# Patient Record
Sex: Female | Born: 1990
Health system: Southern US, Community
[De-identification: ages and names within clinical notes are randomized; demographics above are authoritative.]

## PROBLEM LIST (undated history)

## (undated) DIAGNOSIS — F419 Anxiety disorder, unspecified: Secondary | ICD-10-CM

## (undated) DIAGNOSIS — F329 Major depressive disorder, single episode, unspecified: Secondary | ICD-10-CM

## (undated) DIAGNOSIS — F32A Depression, unspecified: Secondary | ICD-10-CM

## (undated) DIAGNOSIS — N8110 Cystocele, unspecified: Secondary | ICD-10-CM

## (undated) HISTORY — DX: Anxiety disorder, unspecified: F41.9

## (undated) HISTORY — DX: Depression, unspecified: F32.A

## (undated) HISTORY — DX: Cystocele, unspecified: N81.10

## (undated) HISTORY — PX: WISDOM TOOTH EXTRACTION: SHX21

---
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
## 2019-07-19 ENCOUNTER — Other Ambulatory Visit: Payer: Self-pay

## 2019-07-19 ENCOUNTER — Inpatient Hospital Stay (HOSPITAL_COMMUNITY)
Admission: AD | Admit: 2019-07-19 | Discharge: 2019-07-19 | Disposition: A | Discharge status: Home / Self Care | Payer: Medicare Other | Attending: Obstetrics and Gynecology | Admitting: Obstetrics and Gynecology

## 2019-07-19 ENCOUNTER — Encounter (HOSPITAL_COMMUNITY): Payer: Self-pay | Admitting: *Deleted

## 2019-07-19 DIAGNOSIS — Z5329 Procedure and treatment not carried out because of patient's decision for other reasons: Secondary | ICD-10-CM | POA: Diagnosis not present

## 2019-07-19 DIAGNOSIS — M5440 Lumbago with sciatica, unspecified side: Secondary | ICD-10-CM

## 2019-07-19 DIAGNOSIS — Z3201 Encounter for pregnancy test, result positive: Secondary | ICD-10-CM | POA: Insufficient documentation

## 2019-07-19 DIAGNOSIS — G8929 Other chronic pain: Secondary | ICD-10-CM

## 2019-07-19 DIAGNOSIS — M545 Low back pain, unspecified: Secondary | ICD-10-CM

## 2019-07-19 LAB — POCT PREGNANCY, URINE: Preg Test, Ur: POSITIVE — AB

## 2019-07-19 NOTE — MAU Note (Signed)
.   Christine Kaiser is a 28 y.o. at [redacted]w[redacted]d here in MAU reporting: that she took a pregnancy test two weeks ago and wants To know how far she is. Lower back pain 5/10 LMP: 06/07/19 Onset of complaint: 3 days Pain score: 5 Vitals:   07/19/19 1717  BP: 122/70  Pulse: 82  Resp: 16  Temp: 97.7 F (36.5 C)      Lab orders placed from triage: UA/UPT

## 2019-07-19 NOTE — MAU Provider Note (Signed)
History     CSN: [id number]  Arrival date and time: 07/19/19 1610   First Provider Initiated Contact with Patient 07/19/19 1803      Chief Complaint  Patient presents with  . Possible Pregnancy   Christine Kaiser is a 28 y.o. G1P0 at 1573w0d who presents to MAU stating she "wants to know how far along I am." When patient was asked about any concerns or symptoms, pt reports lower back pain. Pt reports a long history of scoliosis, sciatica and arthritis, but reports three days ago her back pain started to "feel different than normal." Pt denies abdominal pain, pelvic pain, vaginal bleeding, or abnormal vaginal discharge. Pt reports she is strongly considering a termination of pregnancy, which is why she wants to know how far along she is.  Pt's boyfriend present for entire visit.   OB History    Gravida  1   Para      Term      Preterm      AB      Living        SAB      TAB      Ectopic      Multiple      Live Births              No past medical history on file.  No family history on file.  Social History   Tobacco Use  . Smoking status: Not on file  Substance Use Topics  . Alcohol use: Not on file  . Drug use: Not on file    Allergies: Not on File  No medications prior to admission.    Review of Systems  Constitutional: Negative for chills, diaphoresis, fatigue and fever.  Respiratory: Negative for shortness of breath.   Cardiovascular: Negative for chest pain.  Gastrointestinal: Negative for abdominal pain, constipation, diarrhea, nausea and vomiting.  Genitourinary: Negative for dysuria, flank pain, frequency, pelvic pain, urgency, vaginal bleeding and vaginal discharge.  Musculoskeletal: Positive for back pain.  Neurological: Negative for dizziness, weakness, light-headedness and headaches.   Physical Exam   Blood pressure 122/70, pulse 82, temperature 97.7 F (36.5 C), resp. rate 16, last menstrual period 06/07/2019.  Patient  Vitals for the past 24 hrs:  BP Temp Pulse Resp  07/19/19 1717 122/70 97.7 F (36.5 C) 82 16   Physical Exam  Constitutional: She is oriented to person, place, and time. She appears well-developed and well-nourished. No distress.  HENT:  Head: Normocephalic and atraumatic.  Respiratory: Effort normal.  GI: Soft.  Neurological: She is alert and oriented to person, place, and time.  Skin: Skin is warm and dry. She is not diaphoretic.  Psychiatric: She has a normal mood and affect. Her behavior is normal. Judgment and thought content normal.   Results for orders placed or performed during the hospital encounter of 07/19/19 (from the past 24 hour(s))  Pregnancy, urine POC     Status: Abnormal   Collection Time: 07/19/19  4:33 PM  Result Value Ref Range   Preg Test, Ur POSITIVE (A) NEGATIVE   No results found.  MAU Course  Procedures  MDM -per Dr. Earlene Plateravis, will do limited work-up for ectopic pregnancy based on new onset back pain that feels different for patient -after conversation with patient in family room, pt was asked to wait in waiting room while provider consulted Dr. Earlene Plateravis regarding symptoms -after conversation with Dr. Earlene Plateravis, labs and US ordered -lab attempted to call patient from the  waiting room 4-5 times with charge nurse, but patient was not found in waiting room and evaluation was unable to be performed.  Orders Placed This Encounter  Procedures  . US OB LESS THAN 14 WEEKS WITH OB TRANSVAGINAL    Standing Status:   Standing    Number of Occurrences:   1    Order Specific Question:   Symptom/Reason for Exam    Answer:   Low back pain [628366]  . Urinalysis, Routine w reflex microscopic    Standing Status:   Standing    Number of Occurrences:   1  . CBC    Standing Status:   Standing    Number of Occurrences:   1  . hCG, quantitative, pregnancy    Standing Status:   Standing    Number of Occurrences:   1  . Pregnancy, urine POC    Standing Status:   Standing     Number of Occurrences:   1  . ABO/Rh    Standing Status:   Standing    Number of Occurrences:   1   No orders of the defined types were placed in this encounter.  Assessment and Plan   -pt left waiting room AMA  Christine Kaiser 07/19/2019, 7:10 PM

## 2019-08-12 ENCOUNTER — Encounter (HOSPITAL_COMMUNITY): Payer: Self-pay

## 2019-08-17 ENCOUNTER — Other Ambulatory Visit: Payer: Self-pay

## 2019-08-17 ENCOUNTER — Ambulatory Visit (INDEPENDENT_AMBULATORY_CARE_PROVIDER_SITE_OTHER): Payer: Medicare Other

## 2019-08-17 DIAGNOSIS — Z3480 Encounter for supervision of other normal pregnancy, unspecified trimester: Secondary | ICD-10-CM | POA: Insufficient documentation

## 2019-08-17 DIAGNOSIS — O99330 Smoking (tobacco) complicating pregnancy, unspecified trimester: Secondary | ICD-10-CM | POA: Insufficient documentation

## 2019-08-17 MED ORDER — VITAFOL GUMMIES 3.33-0.333-34.8 MG PO CHEW: 1.0000 | CHEWABLE_TABLET | Freq: Every day | ORAL | 11 refills | Status: DC

## 2019-08-17 NOTE — Patient Instructions (Signed)
Smoking During Pregnancy ° °Smoking during pregnancy is unhealthy for you and your baby. Smoke from cigarettes, pipes, and cigars contains many chemicals that can cause cancer (carcinogens). Cigarettes also contain a stimulant drug (nicotine). When you smoke, harmful substances that you breathe in enter your bloodstream and can be passed on to your baby. This can affect your baby's development. °If you are planning to become pregnant or have recently become pregnant, talk with your health care provider about quitting smoking. °How does smoking affect me? °Smoking increases your risk for many long-term (chronic) diseases. These diseases include cancer, lung diseases, and heart disease. Smoking during pregnancy increases your risk of: °· Losing the pregnancy (miscarriage or stillbirth). °· Giving birth too early (premature birth). °· Pregnancy outside of the uterus (tubal pregnancy). °· Having problems with the organ that provides the baby nourishment and oxygen (placenta), including: °? Attachment of the placenta over the opening of the uterus (placenta previa). °? Detachment of the placenta before the baby's birth (placental abruption). °· Having your water break before labor begins (premature rupture of membranes). °How does smoking affect my baby? °Before Birth °Smoking during pregnancy: °· Decreases blood flow and oxygen to your baby. °· Increases your baby’s risk of birth defects, such as heart defects. °· Increases your baby's heart rate. °· Slows your baby's growth in the uterus (intrauterine growth retardation). °After Birth °Babies born to women who smoked during pregnancy may: °· Have symptoms of nicotine withdrawal. °· Need to stay in the hospital for special care. °· May be too small at birth. °· Have a high risk of: °? Serious health problems or lifelong disabilities. °? Sudden infant death syndrome (SIDS). °? Becoming obese. °? Developing behavior or learning problems. °What can happen if changes are  not made? °When babies are born with a birth defect or illness, they often need to stay in the hospital longer before going home. Hospital stays may also be longer if you had any complications during labor or delivery. Longer hospital stays and more treatments result in higher costs for health care. °Many health issues among babies born to mothers who smoke can have a lifelong impact. This may include the long-term need for certain medicines, therapies, or other treatments. °What are the benefits of not smoking during pregnancy? °You have a much better chance of having a healthy pregnancy and a healthy baby if you do not smoke while you are pregnant. Not smoking also means that you will have a better chance of living a long and healthy life, and your baby will have a better chance of growing into a healthy child and adult. °What actions can be taken? °Quitting smoking can be difficult. Ask your health care provider for help to stop smoking. You may also consider: °· Counseling to help you quit smoking (smoking cessation counseling). °· Psychotherapy. °· Acupuncture. °· Hypnosis. °· Telephone QUIT hotlines. °If these methods do not help you, talk with your health care provider about other options. Do not take smoking cessation medicines or nicotine supplements unless your health care provider tells you to. °Where to find more information °Learn more about smoking during pregnancy and quitting smoking from: °· [employer] of [employer]: www.marchofdimes.org/pregnancy/smoking-during-pregnancy.aspx °· U.S. Department of Health and Human Services: women.smokefree.gov °· American Cancer Society: www.cancer.org °· American Heart Association: www.heart.org °· National Cancer Institute: www.cancer.gov °For help to quit smoking: °· National smoking cessation telephone hotline: 1-800-QUIT NOW (784-8669) °Contact a health care provider if: °· You are struggling to quit smoking. °· You   are a smoker and you become pregnant or plan to become  pregnant. °· You start smoking again after giving birth. °Summary °· Tobacco smoke contains harmful substances that can affect a baby’s health and development. °· Smoking increases the risk for serious problems, such as miscarriage, birth defects, or premature birth. °· If you need help to quit smoking, ask your health care provider. °This information is not intended to replace advice given to you by your health care provider. Make sure you discuss any questions you have with your health care provider. °Document Released: 04/01/2005 Document Revised: 10/31/2017 Document Reviewed: 09/06/2016 °Elsevier Patient Education © 2020 [employer] ° °

## 2019-08-17 NOTE — Progress Notes (Addendum)
  Virtual Visit via Telephone Note  I connected with Christine Kaiser on 08/17/19 at  1:15 PM EDT by telephone and verified that I am speaking with the correct person using two identifiers.  Location: Patient: Christine Kaiser Kitchen Provider: Hollice Gong, CMA   I discussed the limitations, risks, security and privacy concerns of performing an evaluation and management service by telephone and the availability of in person appointments. I also discussed with the patient that there may be a patient responsible charge related to this service. The patient expressed understanding and agreed to proceed.   History of Present Illness: PRENATAL INTAKE SUMMARY  [name] presents today New OB Nurse Interview.  OB History    Gravida  2   Para  1   Term  1   Preterm      AB      Living  1     SAB      TAB      Ectopic      Multiple      Live Births  1          I have reviewed the patient's medical, obstetrical, social, and family histories, medications, and available lab results.  SUBJECTIVE She has no unusual complaints   Observations/Objective: Initial nurse interview for history/labs (New OB)  EDD: 03/13/2020 GA: [redacted]w[redacted]d G2P1 FHT: non face to face interview   GENERAL APPEARANCE: oriented to person, place and time, non face to face interview   Assessment and Plan: Normal pregnancy Labs and exam to be completed during NOB exam with CNM Start PNV's BP cuff ordered  Download BRx  Download MyChart  Smoking cessation education given    Follow Up Instructions:   I discussed the assessment and treatment plan with the patient. The patient was provided an opportunity to ask questions and all were answered. The patient agreed with the plan and demonstrated an understanding of the instructions.   The patient was advised to call back or seek an in-person evaluation if the symptoms worsen or if the condition fails to improve as anticipated.  I provided 25 minutes of  non-face-to-face time during this encounter.   Maryruth Eve, CMA

## 2019-08-18 NOTE — Progress Notes (Signed)
I have reviewed this chart and agree with the RN/CMA assessment and management.    K. Meryl Sharronda Schweers, M.D. Attending [facility] ([facility]ulty [facility])   

## 2019-08-31 ENCOUNTER — Other Ambulatory Visit (HOSPITAL_COMMUNITY)
Admission: RE | Admit: 2019-08-31 | Discharge: 2019-08-31 | Disposition: A | Discharge status: Home / Self Care | Payer: Medicare Other | Source: Ambulatory Visit | Attending: Advanced Practice Midwife | Admitting: Advanced Practice Midwife

## 2019-08-31 ENCOUNTER — Other Ambulatory Visit: Payer: Self-pay

## 2019-08-31 ENCOUNTER — Ambulatory Visit (INDEPENDENT_AMBULATORY_CARE_PROVIDER_SITE_OTHER): Payer: Medicare Other | Admitting: Advanced Practice Midwife

## 2019-08-31 ENCOUNTER — Encounter: Payer: Self-pay | Admitting: Advanced Practice Midwife

## 2019-08-31 VITALS — BP 118/79 | HR 87 | Ht 59.0 in | Wt 176.0 lb

## 2019-08-31 DIAGNOSIS — Z3480 Encounter for supervision of other normal pregnancy, unspecified trimester: Secondary | ICD-10-CM | POA: Diagnosis present

## 2019-08-31 DIAGNOSIS — N814 Uterovaginal prolapse, unspecified: Secondary | ICD-10-CM | POA: Insufficient documentation

## 2019-08-31 DIAGNOSIS — Z3687 Encounter for antenatal screening for uncertain dates: Secondary | ICD-10-CM

## 2019-08-31 DIAGNOSIS — Z3A12 12 weeks gestation of pregnancy: Secondary | ICD-10-CM

## 2019-08-31 DIAGNOSIS — Z3481 Encounter for supervision of other normal pregnancy, first trimester: Secondary | ICD-10-CM | POA: Diagnosis not present

## 2019-08-31 DIAGNOSIS — Z23 Encounter for immunization: Secondary | ICD-10-CM | POA: Diagnosis not present

## 2019-08-31 NOTE — Patient Instructions (Signed)
Second Trimester of Pregnancy  The second trimester is from week 14 through week 27 (month 4 through 6). This is often the time in pregnancy that you feel your best. Often times, morning sickness has lessened or quit. You may have more energy, and you may get hungry more often. Your unborn baby is growing rapidly. At the end of the sixth month, he or she is about 9 inches long and weighs about 1 pounds. You will likely feel the baby move between 18 and 20 weeks of pregnancy. Follow these instructions at home: Medicines  Take over-the-counter and prescription medicines only as told by your doctor. Some medicines are safe and some medicines are not safe during pregnancy.  Take a prenatal vitamin that contains at least 600 micrograms (mcg) of folic acid.  If you have trouble pooping (constipation), take medicine that will make your stool soft (stool softener) if your doctor approves. Eating and drinking   Eat regular, healthy meals.  Avoid raw meat and uncooked cheese.  If you get low calcium from the food you eat, talk to your doctor about taking a daily calcium supplement.  Avoid foods that are high in fat and sugars, such as fried and sweet foods.  If you feel sick to your stomach (nauseous) or throw up (vomit): ? Eat 4 or 5 small meals a day instead of 3 large meals. ? Try eating a few soda crackers. ? Drink liquids between meals instead of during meals.  To prevent constipation: ? Eat foods that are high in fiber, like fresh fruits and vegetables, whole grains, and beans. ? Drink enough fluids to keep your pee (urine) clear or pale yellow. Activity  Exercise only as told by your doctor. Stop exercising if you start to have cramps.  Do not exercise if it is too hot, too humid, or if you are in a place of great height (high altitude).  Avoid heavy lifting.  Wear low-heeled shoes. Sit and stand up straight.  You can continue to have sex unless your doctor tells you not to.  Relieving pain and discomfort  Wear a good support bra if your breasts are tender.  Take warm water baths (sitz baths) to soothe pain or discomfort caused by hemorrhoids. Use hemorrhoid cream if your doctor approves.  Rest with your legs raised if you have leg cramps or low back pain.  If you develop puffy, bulging veins (varicose veins) in your legs: ? Wear support hose or compression stockings as told by your doctor. ? Raise (elevate) your feet for 15 minutes, 3-4 times a day. ? Limit salt in your food. Prenatal care  Write down your questions. Take them to your prenatal visits.  Keep all your prenatal visits as told by your doctor. This is important. Safety  Wear your seat belt when driving.  Make a list of emergency phone numbers, including numbers for family, friends, the hospital, and police and fire departments. General instructions  Ask your doctor about the right foods to eat or for help finding a counselor, if you need these services.  Ask your doctor about local prenatal classes. Begin classes before month 6 of your pregnancy.  Do not use hot tubs, steam rooms, or saunas.  Do not douche or use tampons or scented sanitary pads.  Do not cross your legs for long periods of time.  Visit your dentist if you have not done so. Use a soft toothbrush to brush your teeth. Floss gently.  Avoid all smoking, herbs,   and alcohol. Avoid drugs that are not approved by your doctor.  Do not use any products that contain nicotine or tobacco, such as cigarettes and e-cigarettes. If you need help quitting, ask your doctor.  Avoid cat litter boxes and soil used by cats. These carry germs that can cause birth defects in the baby and can cause a loss of your baby (miscarriage) or stillbirth. Contact a doctor if:  You have mild cramps or pressure in your lower belly.  You have pain when you pee (urinate).  You have bad smelling fluid coming from your vagina.  You continue to feel  sick to your stomach (nauseous), throw up (vomit), or have watery poop (diarrhea).  You have a nagging pain in your belly area.  You feel dizzy. Get help right away if:  You have a fever.  You are leaking fluid from your vagina.  You have spotting or bleeding from your vagina.  You have severe belly cramping or pain.  You lose or gain weight rapidly.  You have trouble catching your breath and have chest pain.  You notice sudden or extreme puffiness (swelling) of your face, hands, ankles, feet, or legs.  You have not felt the baby move in over an hour.  You have severe headaches that do not go away when you take medicine.  You have trouble seeing. Summary  The second trimester is from week 14 through week 27 (months 4 through 6). This is often the time in pregnancy that you feel your best.  To take care of yourself and your unborn baby, you will need to eat healthy meals, take medicines only if your doctor tells you to do so, and do activities that are safe for you and your baby.  Call your doctor if you get sick or if you notice anything unusual about your pregnancy. Also, call your doctor if you need help with the right food to eat, or if you want to know what activities are safe for you. This information is not intended to replace advice given to you by your health care provider. Make sure you discuss any questions you have with your health care provider. Document Released: 02/12/2010 Document Revised: 03/12/2019 Document Reviewed: 12/24/2016 Elsevier Patient Education  2020 [employer].  About Cystocele  Overview  The pelvic organs, including the bladder, are normally supported by pelvic floor muscles and ligaments.  When these muscles and ligaments are stretched, weakened or torn, the wall between the bladder and the vagina sags or herniates causing a prolapse, sometimes called a cystocele.  This condition may cause discomfort and problems with emptying the bladder.   It can be present in various stages.  Some people are not aware of the changes.  Others may notice changes at the vaginal opening or a feeling of the bladder dropping outside the body.  Causes of a Cystocele  A cystocele is usually caused by muscle straining or stretching during childbirth.  In addition, cystocele is more common after menopause, because the hormone estrogen helps keep the elastic tissues around the pelvic organs strong.  A cystocele is more likely to occur when levels of estrogen decrease.  Other causes include: heavy lifting, chronic coughing, previous pelvic surgery and obesity.  Symptoms  A bladder that has dropped from its normal position may cause: unwanted urine leakage (stress incontinence), frequent urination or urge to urinate, incomplete emptying of the bladder (not feeling bladder relief after emptying), pain or discomfort in the vagina, pelvis, groin, lower back  or lower abdomen and frequent urinary tract infections.  Mild cases may not cause any symptoms.  Treatment Options  Pelvic floor (Kegel) exercises:  Strength training the muscles in your genital area  Behavioral changes: Treating and preventing constipation, taking time to empty your bladder properly, learning to lift properly and/or avoid heavy lifting when possible, stopping smoking, avoiding weight gain and treating a chronic cough or bronchitis.  A pessary: A vaginal support device is sometimes used to help pelvic support caused by muscle and ligament changes.  Surgery: Surgical repair may be necessary if symptoms cannot be managed with exercise, behavioral changes and a pessary.  Surgery is usually considered for severe cases.   2007, [employer]

## 2019-08-31 NOTE — Addendum Note (Signed)
Addended by: Lewie Loron D on: 08/31/2019 04:32 PM   Modules accepted: Orders

## 2019-08-31 NOTE — Addendum Note (Signed)
Addended by: Lewie Loron D on: 08/31/2019 04:25 PM   Modules accepted: Orders

## 2019-08-31 NOTE — Progress Notes (Signed)
Subjective:   Christine Kaiser is a 28 y.o. G2P1001 at [redacted]w[redacted]d by LMP being seen today for her first obstetrical visit.  Her obstetrical history is significant for NSVD at term x 1 and has Supervision of other normal pregnancy, antepartum; Tobacco smoking affecting pregnancy, antepartum; and Cystocele with prolapse on their problem list.. Patient does intend to breast feed. Pregnancy history fully reviewed.  Patient reports no complaints.  HISTORY: OB History  Gravida Para Term Preterm AB Living  2 1 1  0 0 1  SAB TAB Ectopic Multiple Live Births  0 0 0 0 1    # Outcome Date GA Lbr Len/2nd Weight Sex Delivery Anes PTL Lv  2 Current           1 Term 09/20/14 [redacted]w[redacted]d    Vag-Spont EPI  LIV   Past Medical History:  Diagnosis Date  . Anxiety and depression    History reviewed. No pertinent surgical history. Family History  Problem Relation Age of Onset  . Colon cancer Mother   . Sleep apnea Father   . Diabetes Father    Social History   Tobacco Use  . Smoking status: Current Every Day Smoker    Packs/day: 1.00    Types: Cigarettes  . Smokeless tobacco: Never Used  . Tobacco comment: 1 pack every 3 days   Substance Use Topics  . Alcohol use: Not Currently  . Drug use: Not Currently    Types: Marijuana   Allergies  Allergen Reactions  . Coconut Oil    Current Outpatient Medications on File Prior to Visit  Medication Sig Dispense Refill  . Prenatal Vit-Fe Phos-FA-Omega (VITAFOL GUMMIES) 3.33-0.333-34.8 MG CHEW Chew 1 tablet by mouth daily. 90 tablet 11   No current facility-administered medications on file prior to visit.     Indications for ASA therapy (per uptodate) One of the following: Previous pregnancy with preeclampsia, especially early onset and with an adverse outcome No Multifetal gestation No Chronic hypertension No Type 1 or 2 diabetes mellitus No Chronic kidney disease No Autoimmune disease (antiphospholipid syndrome, systemic lupus erythematosus) No    Two or more of the following: Nulliparity No Obesity (body mass index >30 kg/m2) Yes Family history of preeclampsia in mother or sister No Age ?35 years No Sociodemographic characteristics (African American race, low socioeconomic level) No Personal risk factors (eg, previous pregnancy with low birth weight or small for gestational age infant, previous adverse pregnancy outcome [eg, stillbirth], interval >10 years between pregnancies) No    Exam   Vitals:   08/31/19 1349 08/31/19 1351  BP: 118/79   Pulse: 87   Weight: 176 lb (79.8 kg)   Height:  4\' 11"  (1.499 m)      Uterus:     Pelvic Exam: Perineum: no hemorrhoids, normal perineum   Vulva: normal external genitalia, no lesions   Vagina:  normal mucosa, normal discharge   Cervix: no lesions and normal, pap smear done.    Adnexa: normal adnexa and no mass, fullness, tenderness   Bony Pelvis: average  System: General: well-developed, well-nourished female in no acute distress   Breast:  normal appearance, no masses or tenderness   Skin: normal coloration and turgor, no rashes   Neurologic: oriented, normal, negative, normal mood   Extremities: normal strength, tone, and muscle mass, ROM of all joints is normal   HEENT PERRLA, extraocular movement intact and sclera clear, anicteric   Mouth/Teeth mucous membranes moist, pharynx normal without lesions and dental hygiene  good   Neck supple and no masses   Cardiovascular: regular rate and rhythm   Respiratory:  no respiratory distress, normal breath sounds   Abdomen: soft, non-tender; bowel sounds normal; no masses,  no organomegaly     Assessment:   Pregnancy: G2P1001 Patient Active Problem List   Diagnosis Date Noted  . Cystocele with prolapse 08/31/2019  . Supervision of other normal pregnancy, antepartum 08/17/2019  . Tobacco smoking affecting pregnancy, antepartum 08/17/2019     Plan:  1. Cystocele with prolapse --Pt saw [facility] in 2018 for urinary  difficulty/retention, having to lean forward to urinate and was diagnosed with stage 1 cystocele and fitted for pessary.  Pt wears pessary daily.  She was referred to physical therapy but was delayed due to COVID pandemic.   --On exam, cystocele is not prominent, even with valsalva.  Will try to get pt in for PT now in pregnancy, and PP as needed.  If pt prefers to continue pessary, it is safe to do so in pregnancy. - Ambulatory referral to Physical Therapy  2. Supervision of other normal pregnancy, antepartum --Anticipatory guidance about next visits/weeks of pregnancy given.  - Obstetric Panel, Including HIV - Genetic Screening - Culture, OB Urine - Babyscripts Schedule Optimization  3. Unsure of LMP (last menstrual period) as reason for ultrasound scan --Bedside US reveals active fetus, FHR 160s.  Dating [place] ordered as bedside US indicates possible dates discrepancy but unable to perform measurements today. - US OB Comp Less 14 Wks; Future   Initial labs drawn. Continue prenatal vitamins. Discussed and offered genetic screening options, including Quad screen/AFP, NIPS testing, and option to decline testing. Benefits/risks/alternatives reviewed. Pt aware that anatomy US is form of genetic screening with lower accuracy in detecting trisomies than blood work.  Pt chooses genetic screening today. NIPS: ordered. Ultrasound discussed; fetal anatomic survey: requested. Problem list reviewed and updated. The nature of [place] [facility] with multiple MDs and other Advanced Practice Providers was explained to patient; also emphasized that residents, students are part of our team. Routine obstetric precautions reviewed. No follow-ups on file.   Sharen Counter, CNM 08/31/19 2:57 PM

## 2019-09-01 LAB — CERVICOVAGINAL ANCILLARY ONLY
Chlamydia: NEGATIVE
Molecular Disclaimer: NEGATIVE
Molecular Disclaimer: NORMAL
Neisseria Gonorrhea: NEGATIVE

## 2019-09-02 ENCOUNTER — Other Ambulatory Visit: Payer: Medicare Other

## 2019-09-02 ENCOUNTER — Other Ambulatory Visit: Payer: Self-pay

## 2019-09-02 LAB — CYTOLOGY - PAP: Diagnosis: NEGATIVE

## 2019-09-02 LAB — CULTURE, OB URINE

## 2019-09-02 LAB — URINE CULTURE, OB REFLEX

## 2019-09-03 LAB — OBSTETRIC PANEL, INCLUDING HIV
Antibody Screen: NEGATIVE
Basophils Absolute: 0 10*3/uL (ref 0.0–0.2)
Basos: 0 %
EOS (ABSOLUTE): 0.1 10*3/uL (ref 0.0–0.4)
Eos: 1 %
HIV Screen 4th Generation wRfx: NONREACTIVE
Hematocrit: 40.1 % (ref 34.0–46.6)
Hemoglobin: 13.4 g/dL (ref 11.1–15.9)
Hepatitis B Surface Ag: NEGATIVE
Immature Grans (Abs): 0 10*3/uL (ref 0.0–0.1)
Immature Granulocytes: 0 %
Lymphocytes Absolute: 2.1 10*3/uL (ref 0.7–3.1)
Lymphs: 24 %
MCH: 29.7 pg (ref 26.6–33.0)
MCHC: 33.4 g/dL (ref 31.5–35.7)
MCV: 89 fL (ref 79–97)
Monocytes Absolute: 0.5 10*3/uL (ref 0.1–0.9)
Monocytes: 5 %
Neutrophils Absolute: 6.1 10*3/uL (ref 1.4–7.0)
Neutrophils: 70 %
Platelets: 215 10*3/uL (ref 150–450)
RBC: 4.51 x10E6/uL (ref 3.77–5.28)
RDW: 12.7 % (ref 11.7–15.4)
RPR Ser Ql: NONREACTIVE
Rh Factor: POSITIVE
Rubella Antibodies, IGG: <0.9 index — ABNORMAL LOW (ref >0.99)
WBC: 8.8 10*3/uL (ref 3.4–10.8)

## 2019-09-08 ENCOUNTER — Encounter: Payer: Self-pay | Admitting: Advanced Practice Midwife

## 2019-09-08 ENCOUNTER — Ambulatory Visit (INDEPENDENT_AMBULATORY_CARE_PROVIDER_SITE_OTHER): Payer: Medicare Other

## 2019-09-08 ENCOUNTER — Ambulatory Visit (HOSPITAL_COMMUNITY)
Admission: RE | Admit: 2019-09-08 | Discharge: 2019-09-08 | Disposition: A | Discharge status: Home / Self Care | Payer: Medicare Other | Source: Ambulatory Visit | Attending: Advanced Practice Midwife | Admitting: Advanced Practice Midwife

## 2019-09-08 ENCOUNTER — Other Ambulatory Visit: Payer: Self-pay

## 2019-09-08 DIAGNOSIS — Z3687 Encounter for antenatal screening for uncertain dates: Secondary | ICD-10-CM | POA: Diagnosis present

## 2019-09-08 DIAGNOSIS — Z712 Person consulting for explanation of examination or test findings: Secondary | ICD-10-CM

## 2019-09-08 DIAGNOSIS — Z2839 Other underimmunization status: Secondary | ICD-10-CM | POA: Insufficient documentation

## 2019-09-08 DIAGNOSIS — Z283 Underimmunization status: Secondary | ICD-10-CM | POA: Insufficient documentation

## 2019-09-08 NOTE — Progress Notes (Signed)
Pt here following ultrasound for results. Results reviewed with Kerry Hough, PA who finds single, living IUP. Pt given results and pictures. Notified pt of new dating: 13w 6d today with EDD 03/09/2020. Pt has no further questions and will continue care at [facility] office.   Apolonio Schneiders RN 09/08/19

## 2019-09-08 NOTE — Progress Notes (Signed)
Patient seen and assessed by nursing staff during this encounter. I have reviewed the chart and agree with the documentation and plan.  Kerry Hough, PA-C 09/08/2019 3:47 PM

## 2019-09-15 ENCOUNTER — Ambulatory Visit: Payer: Medicare Other | Attending: Advanced Practice Midwife | Admitting: Physical Therapy

## 2019-09-28 ENCOUNTER — Other Ambulatory Visit: Payer: Self-pay

## 2019-09-28 ENCOUNTER — Ambulatory Visit (INDEPENDENT_AMBULATORY_CARE_PROVIDER_SITE_OTHER): Payer: Medicare Other | Admitting: Advanced Practice Midwife

## 2019-09-28 VITALS — BP 120/78 | HR 103 | Wt 182.6 lb

## 2019-09-28 DIAGNOSIS — Z3A19 19 weeks gestation of pregnancy: Secondary | ICD-10-CM

## 2019-09-28 DIAGNOSIS — Z3480 Encounter for supervision of other normal pregnancy, unspecified trimester: Secondary | ICD-10-CM

## 2019-09-28 DIAGNOSIS — N8110 Cystocele, unspecified: Secondary | ICD-10-CM

## 2019-09-28 DIAGNOSIS — O3482 Maternal care for other abnormalities of pelvic organs, second trimester: Secondary | ICD-10-CM

## 2019-09-28 DIAGNOSIS — O26892 Other specified pregnancy related conditions, second trimester: Secondary | ICD-10-CM

## 2019-09-28 DIAGNOSIS — R109 Unspecified abdominal pain: Secondary | ICD-10-CM

## 2019-09-28 MED ORDER — COMFORT FIT MATERNITY SUPP MED MISC: 1.0000 | Freq: Every day | 0 refills | Status: DC

## 2019-09-28 MED ORDER — BLOOD PRESSURE MONITOR KIT: 1.0000 | PACK | Freq: Once | 0 refills | Status: AC

## 2019-09-28 NOTE — Progress Notes (Signed)
   PRENATAL VISIT NOTE  Subjective:  Christine Kaiser is a 28 y.o. G2P1001 at [redacted]w[redacted]d being seen today for ongoing prenatal care.  She is currently monitored for the following issues for this low-risk pregnancy and has Supervision of other normal pregnancy, antepartum; Tobacco smoking affecting pregnancy, antepartum; Cystocele with prolapse; and Rubella non-immune status, antepartum on their problem list.  Patient reports backache and one episode of lower right quadrant abdominal pain that lasted for two days and ended two days ago.  Contractions: Not present. Vag. Bleeding: None.  Movement: Present. Denies leaking of fluid.   The following portions of the patient's history were reviewed and updated as appropriate: allergies, current medications, past family history, past medical history, past social history, past surgical history and problem list.   Objective:   Vitals:   09/28/19 1454  BP: 120/78  Pulse: (!) 103  Weight: 82.8 kg    Fetal Status: Fetal Heart Rate (bpm): 145   Movement: Present     General:  Alert, oriented and cooperative. Patient is in no acute distress.  Skin: Skin is warm and dry. No rash noted.   Cardiovascular: Normal heart rate noted  Respiratory: Normal respiratory effort, no problems with respiration noted  Abdomen: Soft, gravid, appropriate for gestational age.  Pain/Pressure: Present     Pelvic: Cervical exam deferred        Extremities: Normal range of motion.  Edema: None  Mental Status: Normal mood and affect. Normal behavior. Normal judgment and thought content.   Assessment and Plan:  Pregnancy: G2P1001 at [redacted]w[redacted]d  1. Supervision of other normal pregnancy, antepartum Stable.  Anatomy ultrasound ordered.  OB follow up appointment in 4 weeks - AFP, Serum, Open Spina Bifida - [place] MFM OB COMP + 14 WK; Future  2. Abdominal pain during pregnancy in second trimester Patient advised to try utilizing a maternity support belt to relieve abdominal pain -  Elastic Bandages & Supports (COMFORT FIT MATERNITY SUPP MED) MISC; 1 Device by Does not apply route daily.  Dispense: 1 each; Refill: 0   Preterm labor symptoms and general obstetric precautions including but not limited to vaginal bleeding, contractions, leaking of fluid and fetal movement were reviewed in detail with the patient. Please refer to After Visit Summary for other counseling recommendations.   Return in about 4 weeks (around 10/26/2019).  Future Appointments  Date Time Provider [place]  09/29/2019 11:45 AM Monico Hoar, PT [facility]  10/26/2019  3:15 PM Left[place]-Kirby, Kathie Dike, CNM [place] None    Dorothyann Peng, Student-PA

## 2019-09-28 NOTE — Progress Notes (Signed)
   PRENATAL VISIT NOTE  Subjective:  Christine Kaiser is a 28 y.o. G2P1001 at [redacted]w[redacted]d being seen today for ongoing prenatal care.  She is currently monitored for the following issues for this low-risk pregnancy and has Supervision of other normal pregnancy, antepartum; Tobacco smoking affecting pregnancy, antepartum; Cystocele with prolapse; and Rubella non-immune status, antepartum on their problem list.  Patient reports occasional sharp low abdominal pain with movement.  Contractions: Not present. Vag. Bleeding: None.  Movement: Present. Denies leaking of fluid.   The following portions of the patient's history were reviewed and updated as appropriate: allergies, current medications, past family history, past medical history, past social history, past surgical history and problem list.   Objective:   Vitals:   09/28/19 1454  BP: 120/78  Pulse: (!) 103  Weight: 182 lb 9.6 oz (82.8 kg)    Fetal Status: Fetal Heart Rate (bpm): 145   Movement: Present     General:  Alert, oriented and cooperative. Patient is in no acute distress.  Skin: Skin is warm and dry. No rash noted.   Cardiovascular: Normal heart rate noted  Respiratory: Normal respiratory effort, no problems with respiration noted  Abdomen: Soft, gravid, appropriate for gestational age.  Pain/Pressure: Present     Pelvic: Cervical exam deferred        Extremities: Normal range of motion.  Edema: None  Mental Status: Normal mood and affect. Normal behavior. Normal judgment and thought content.   Assessment and Plan:  Pregnancy: G2P1001 at [redacted]w[redacted]d 1. Supervision of other normal pregnancy, antepartum --Anticipatory guidance about next visits/weeks of pregnancy given. - AFP, Serum, Open Spina Bifida - [place] MFM OB COMP + 14 WK; Future  2. Abdominal pain during pregnancy in second trimester --Occasional sharp bilateral low abdominal pain. --Rest/ice/heat/warm bath/Tylenol/pregnancy support belt - Elastic Bandages & Supports  (COMFORT FIT MATERNITY SUPP MED) MISC; 1 Device by Does not apply route daily.  Dispense: 1 each; Refill: 0  3. Cystocele affecting pregnancy in second trimester --Pt has pessary out currently without problems.  Fitted for pessary due to urinary issues, difficulty emptying bladder before.   --Pt starts PT tomorrow.  Discussed safety of pessary in pregnancy but if she is doing well, I encouraged her to leave it out and work on pelvic floor with exercises.  Preterm labor symptoms and general obstetric precautions including but not limited to vaginal bleeding, contractions, leaking of fluid and fetal movement were reviewed in detail with the patient. Please refer to After Visit Summary for other counseling recommendations.   Return in about 4 weeks (around 10/26/2019).  Future Appointments  Date Time Provider [place]  09/29/2019 11:45 AM Monico Hoar, PT [facility]  10/14/2019  1:30 PM [place] 1 [facility]  10/26/2019  3:15 PM Leftwich-Kirby, Kathie Dike, CNM [facility] None    Fatima Blank, CNM

## 2019-09-29 ENCOUNTER — Ambulatory Visit: Payer: Medicare Other | Admitting: Physical Therapy

## 2019-10-01 LAB — AFP, SERUM, OPEN SPINA BIFIDA
AFP MoM: 1.11
AFP Value: 35.2 ng/mL
Gest. Age on Collection Date: 16.6 weeks
Maternal Age At EDD: 27.8 yr
OSBR Risk 1 IN: 8647
Test Results:: NEGATIVE
Weight: 183 [lb_av]

## 2019-10-14 ENCOUNTER — Other Ambulatory Visit: Payer: Self-pay

## 2019-10-14 ENCOUNTER — Other Ambulatory Visit: Payer: Self-pay | Admitting: Advanced Practice Midwife

## 2019-10-14 ENCOUNTER — Ambulatory Visit (HOSPITAL_COMMUNITY)
Admission: RE | Admit: 2019-10-14 | Discharge: 2019-10-14 | Disposition: A | Discharge status: Home / Self Care | Payer: Medicare Other | Source: Ambulatory Visit | Attending: Advanced Practice Midwife | Admitting: Advanced Practice Midwife

## 2019-10-14 DIAGNOSIS — O99212 Obesity complicating pregnancy, second trimester: Secondary | ICD-10-CM | POA: Diagnosis not present

## 2019-10-14 DIAGNOSIS — Z3480 Encounter for supervision of other normal pregnancy, unspecified trimester: Secondary | ICD-10-CM | POA: Diagnosis not present

## 2019-10-14 DIAGNOSIS — Z3A19 19 weeks gestation of pregnancy: Secondary | ICD-10-CM | POA: Diagnosis not present

## 2019-10-15 ENCOUNTER — Other Ambulatory Visit (HOSPITAL_COMMUNITY): Payer: Self-pay | Admitting: *Deleted

## 2019-10-15 DIAGNOSIS — Z362 Encounter for other antenatal screening follow-up: Secondary | ICD-10-CM

## 2019-10-19 ENCOUNTER — Encounter: Payer: Self-pay | Admitting: Advanced Practice Midwife

## 2019-10-19 DIAGNOSIS — O4402 Placenta previa specified as without hemorrhage, second trimester: Secondary | ICD-10-CM | POA: Insufficient documentation

## 2019-10-26 ENCOUNTER — Ambulatory Visit (INDEPENDENT_AMBULATORY_CARE_PROVIDER_SITE_OTHER): Payer: Medicare Other | Admitting: Advanced Practice Midwife

## 2019-10-26 DIAGNOSIS — G479 Sleep disorder, unspecified: Secondary | ICD-10-CM

## 2019-10-26 DIAGNOSIS — F418 Other specified anxiety disorders: Secondary | ICD-10-CM

## 2019-10-26 DIAGNOSIS — O3482 Maternal care for other abnormalities of pelvic organs, second trimester: Secondary | ICD-10-CM

## 2019-10-26 DIAGNOSIS — Z3A20 20 weeks gestation of pregnancy: Secondary | ICD-10-CM

## 2019-10-26 DIAGNOSIS — O99342 Other mental disorders complicating pregnancy, second trimester: Secondary | ICD-10-CM

## 2019-10-26 DIAGNOSIS — Z3480 Encounter for supervision of other normal pregnancy, unspecified trimester: Secondary | ICD-10-CM

## 2019-10-26 DIAGNOSIS — N8110 Cystocele, unspecified: Secondary | ICD-10-CM

## 2019-10-26 DIAGNOSIS — O4402 Placenta previa specified as without hemorrhage, second trimester: Secondary | ICD-10-CM

## 2019-10-26 DIAGNOSIS — F419 Anxiety disorder, unspecified: Secondary | ICD-10-CM

## 2019-10-26 MED ORDER — HYDROXYZINE PAMOATE 25 MG PO CAPS: 25.0000 mg | ORAL_CAPSULE | Freq: Three times a day (TID) | ORAL | 0 refills | Status: DC | PRN

## 2019-10-26 NOTE — Progress Notes (Signed)
TELEHEALTH OBSTETRICS PRENATAL VIRTUAL VIDEO VISIT ENCOUNTER NOTE  Provider location: [facility] [employer] [facility] at [place]   I connected with Christine Kaiser on 10/26/19 at  3:15 PM EST by WebEx Video Encounter at home and verified that I am speaking with the correct person using two identifiers.   I discussed the limitations, risks, security and privacy concerns of performing an evaluation and management service virtually and the availability of in person appointments. I also discussed with the patient that there may be a patient responsible charge related to this service. The patient expressed understanding and agreed to proceed. Subjective:  Christine Kaiser is a 28 y.o. G2P1001 at [redacted]w[redacted]d being seen today for ongoing prenatal care.  She is currently monitored for the following issues for this low-risk pregnancy and has Supervision of other normal pregnancy, antepartum; Tobacco smoking affecting pregnancy, antepartum; Cystocele with prolapse; Rubella non-immune status, antepartum; and Placenta previa antepartum in second trimester on their problem list.  Patient reports problems sleeping and anxiety at night.  Contractions: Not present. Vag. Bleeding: None.  Movement: Present. Denies any leaking of fluid.   The following portions of the patient's history were reviewed and updated as appropriate: allergies, current medications, past family history, past medical history, past social history, past surgical history and problem list.   Objective:  There were no vitals filed for this visit.  Fetal Status:     Movement: Present     General:  Alert, oriented and cooperative. Patient is in no acute distress.  Respiratory: Normal respiratory effort, no problems with respiration noted  Mental Status: Normal mood and affect. Normal behavior. Normal judgment and thought content.  Rest of physical exam deferred due to type of encounter  Imaging: [place] Mfm Ob Detail +14 Wk  Result Date:  10/14/2019 ----------------------------------------------------------------------  OBSTETRICS REPORT                       (Signed Final 10/14/2019 03:09 pm) ---------------------------------------------------------------------- Patient Info  ID #:       [id number]                          D.O.B.:  Jan 15, 1991 (28 yrs)  Name:       Christine Kaiser               Visit Date: 10/14/2019 01:53 pm ---------------------------------------------------------------------- Performed By  Performed By:     Percell Boston          Ref. Address:     [address]                                                             [phone]  [place]                                                             [id number]  Attending:        Ma RingsVictor Fang MD         [facility]  Referred By:      [facility] ---------------------------------------------------------------------- Orders   #  Description                          Code         Ordered By   1  US MFM OB DETAIL +14 WK              76811.01     Dama Hedgepeth LEFTWICH-                                                        KIRBY  ----------------------------------------------------------------------   #  Order #                    Accession #                 Episode #   1  [id number]                  [id number]                  [id number]  ---------------------------------------------------------------------- Indications   Obesity complicating pregnancy, second         O99.212   trimester   [redacted] weeks gestation of pregnancy                Z3A.19   Encounter for antenatal screening for          Z36.3   malformations(neg AFP, low risk NIPS,   9.6FF)  ---------------------------------------------------------------------- Fetal Evaluation  Num Of Fetuses:         1   Fetal Heart Rate(bpm):  140  Cardiac Activity:       Observed  Presentation:           Breech  Placenta:               Posterior, low-lying,1.6 cm from int os  P. Cord Insertion:      Visualized, central  Amniotic Fluid  AFI FV:      Within normal limits                              Largest Pocket(cm)  3.23 ---------------------------------------------------------------------- Biometry  BPD:      40.1  mm     G. Age:  18w 1d         17  %    CI:         68.1   %    70 - 86                                                          FL/HC:      17.1   %    16.1 - 18.3  HC:      155.4  mm     G. Age:  18w 3d         19  %    HC/AC:      1.19        1.09 - 1.39  AC:      130.8  mm     G. Age:  18w 4d         32  %    FL/BPD:     66.3   %  FL:       26.6  mm     G. Age:  18w 1d         15  %    FL/AC:      20.3   %    20 - 24  HUM:      26.6  mm     G. Age:  18w 3d         34  %  CER:      20.3  mm     G. Age:  19w 2d         57  %  NFT:       2.8  mm  CM:        5.1  mm  Est. FW:     237  gm      0 lb 8 oz     15  % ---------------------------------------------------------------------- OB History  Gravidity:    2         Term:   1        Prem:   0        SAB:   0  TOP:          0       Ectopic:  0        Living: 1 ---------------------------------------------------------------------- Gestational Age  LMP:           18w 3d        Date:  06/07/19                 EDD:   03/13/20  U/S Today:     18w 2d                                        EDD:   03/14/20  Best:          19w 0d     Det. ByLoman Chroman         EDD:   03/09/20                                      (  09/08/19) ---------------------------------------------------------------------- Anatomy  Cranium:               Appears normal         Aortic Arch:            Not well visualized  Cavum:                 Appears normal         Ductal Arch:            Appears normal  Ventricles:            Appears normal         Diaphragm:               Appears normal  Choroid Plexus:        Appears normal         Stomach:                Appears normal, left                                                                        sided  Cerebellum:            Appears normal         Abdomen:                Appears normal  Posterior Fossa:       Appears normal         Abdominal Wall:         Appears nml (cord                                                                        insert, abd wall)  Nuchal Fold:           Appears normal         Cord Vessels:           Appears normal (3                                                                        vessel cord)  Face:                  Not well visualized    Kidneys:                Appear normal  Lips:                  Not well visualized    Bladder:                Appears normal  Thoracic:              Appears normal  Spine:                  Appears normal  Heart:                 Not well visualized    Upper Extremities:      Appears normal  RVOT:                  Not well visualized    Lower Extremities:      Appears normal  Other:  Lt heel visualized. 5th digits visualized. Parents do not wish to know          sex of fetus. Technically difficult due to maternal habitus and fetal          position. ---------------------------------------------------------------------- Cervix Uterus Adnexa  Cervix  Length:            3.1  cm.  Normal appearance by transabdominal scan.  Uterus  No abnormality visualized.  Left Ovary  No adnexal mass visualized.  Right Ovary  No adnexal mass visualized.  Cul De Sac  No free fluid seen.  Adnexa  No abnormality visualized. ---------------------------------------------------------------------- Comments  This patient was seen for a detailed fetal anatomy scan due  to maternal obesity. She denies any significant past medical  history and denies any problems in her current pregnancy.  She had a cell free DNA test earlier in her pregnancy which  indicated a low risk for trisomy  43, 76, and 13.  The patient  did not want the fetal gender revealed today.  She was informed that the fetal growth and amniotic fluid  level were appropriate for her gestational age.  The views of the fetal anatomy were limited today due to the  fetal position and maternal body habitus.  The patient was informed that anomalies may be missed due  to technical limitations. If the fetus is in a suboptimal position  or maternal habitus is increased, visualization of the fetus in  the maternal uterus may be impaired.  A partial placenta previa was noted on today's exam.  Precautions due to the placenta previa were discussed today.  A follow-up exam was scheduled in 4 weeks to obtain better  views of the fetal anatomy and to assess the placental  location. ----------------------------------------------------------------------                   Ma Rings, MD Electronically Signed Final Report   10/14/2019 03:09 pm ----------------------------------------------------------------------   Assessment and Plan:  Pregnancy: G2P1001 at [redacted]w[redacted]d 1. Supervision of other normal pregnancy, antepartum --Pt reports good fetal movement, denies cramping, LOF, or vaginal bleeding --Anticipatory guidance about next visits/weeks of pregnancy given. --Next appt in 2 weeks virtual  2. Cystocele affecting pregnancy in second trimester --Pt was wearing a pessary in early pregnancy, fitted after her previous delivery.  She is not wearing pessary now and is doing well, denies any problems urinating or any pelvic pressure or pain. --Pt had PT appt that she missed, encouraged to call to reschedule for appt during pregnancy, and at least to follow up postpartum.  3. Sleep disturbance --Sleep issues related to pregnancy and anxiety, see below.  Pt to try Vistaril PRN in addition to good sleep hygiene before bed.  Pt encouraged to add pillows, mattress topper to make bed more comfortable.  4. Situational anxiety --Pt with recent news  that her ex, the father of her [facility] child died related to cancer.  She reports lost sleep, racing  thoughts.  She denies any thoughts of harming herself or others. Desires to speak with counselor.  - hydrOXYzine (VISTARIL) 25 MG capsule; Take 1-2 capsules (25-50 mg total) by mouth 3 (three) times daily as needed for anxiety.  Dispense: 30 capsule; Refill: 0  --Referral to [place], appt made with [facility] as soon as possible.  6. Placenta previa antepartum in second trimester --Per 19 week [place].  I reviewed this finding with pt and answered questions. F/U US scheduled with MFM to reevaluate.  --Pt denies any bleeding or pain.  Preterm labor symptoms and general obstetric precautions including but not limited to vaginal bleeding, contractions, leaking of fluid and fetal movement were reviewed in detail with the patient. I discussed the assessment and treatment plan with the patient. The patient was provided an opportunity to ask questions and all were answered. The patient agreed with the plan and demonstrated an understanding of the instructions. The patient was advised to call back or seek an in-person office evaluation/go to [facility] at [facility] for any urgent or concerning symptoms. Please refer to After Visit Summary for other counseling recommendations.   I provided 15 minutes of face-to-face time during this encounter.  No follow-ups on file.  Future Appointments  Date Time Provider [place]  11/11/2019  1:30 PM [facility] [place] 1 WH-[facility]US [facility]    Tamara Monteith Leftwich-Kirby, [place] for [employer], [place]

## 2019-11-09 ENCOUNTER — Ambulatory Visit (INDEPENDENT_AMBULATORY_CARE_PROVIDER_SITE_OTHER): Payer: Medicare Other | Admitting: Obstetrics

## 2019-11-09 ENCOUNTER — Other Ambulatory Visit: Payer: Self-pay

## 2019-11-09 ENCOUNTER — Encounter: Payer: Self-pay | Admitting: Obstetrics

## 2019-11-09 VITALS — BP 136/78 | HR 104

## 2019-11-09 DIAGNOSIS — O3482 Maternal care for other abnormalities of pelvic organs, second trimester: Secondary | ICD-10-CM

## 2019-11-09 DIAGNOSIS — K219 Gastro-esophageal reflux disease without esophagitis: Secondary | ICD-10-CM

## 2019-11-09 DIAGNOSIS — Z3A22 22 weeks gestation of pregnancy: Secondary | ICD-10-CM

## 2019-11-09 DIAGNOSIS — O0990 Supervision of high risk pregnancy, unspecified, unspecified trimester: Secondary | ICD-10-CM

## 2019-11-09 DIAGNOSIS — N8110 Cystocele, unspecified: Secondary | ICD-10-CM

## 2019-11-09 DIAGNOSIS — O0992 Supervision of high risk pregnancy, unspecified, second trimester: Secondary | ICD-10-CM

## 2019-11-09 DIAGNOSIS — O4402 Placenta previa specified as without hemorrhage, second trimester: Secondary | ICD-10-CM

## 2019-11-09 MED ORDER — FAMOTIDINE 20 MG PO TABS: 20.0000 mg | ORAL_TABLET | Freq: Two times a day (BID) | ORAL | 5 refills | Status: DC

## 2019-11-09 NOTE — BH Specialist Note (Signed)
Integrated Behavioral Health via Telemedicine Video Visit  11/09/2019 Christine Kaiser [id number]  Number of [place] visits: 1 Session Start time: 2:15  Session End time: 2:46 Total time: 31  Referring Provider: Fatima Kaiser, CNM Type of Visit: Video Patient/Family location: Home [facility] Provider location: [facility] All persons participating in visit: [name] and Christine Kaiser    Confirmed patient's address: Yes  Confirmed patient's phone number: Yes  Any changes to demographics: No   Confirmed patient's insurance: Yes  Any changes to patient's insurance: No   Discussed confidentiality: Yes   I connected with Christine Kaiser by a video enabled telemedicine application and verified that I am speaking with the correct person using two identifiers.     I discussed the limitations of evaluation and management by telemedicine and the availability of in person appointments.  I discussed that the purpose of this visit is to provide behavioral health care while limiting exposure to the novel coronavirus.   Discussed there is a possibility of technology failure and discussed alternative modes of communication if that failure occurs.  I discussed that engaging in this video visit, they consent to the provision of behavioral healthcare and the services will be billed under their insurance.  Patient and/or legal guardian expressed understanding and consented to video visit: Yes   PRESENTING CONCERNS: Patient and/or family reports the following symptoms/concerns: Pt states her primary concern today is an increase in anxiety,sleep difficulty, and chewing inside her cheek, after grieving loss of her 1yo son's father in November; uses self-coping strategies and began taking vistaril.  Duration of problem: Over one month increase; anxiety ongoing; Severity of problem: moderate  STRENGTHS (Protective Factors/Coping Skills): Open to treatment and uses  daily self-coping strategies  GOALS ADDRESSED: Patient will: 1.  Reduce symptoms of: anxiety and stress  2.  Increase knowledge and/or ability of: healthy habits  3.  Demonstrate ability to: Increase healthy adjustment to current life circumstances and Continue healthy grieving over loss  INTERVENTIONS: Interventions utilized:  Supportive Counseling, Medication Monitoring and Psychoeducation and/or Health Education Standardized Assessments completed: Not Needed  ASSESSMENT: Patient currently experiencing Grief and Anxiety disorder, unspecified  Patient may benefit from psychoeducation and brief therapeutic interventions regarding coping with symptoms of anxiety escalated by recent grief .  PLAN: 1. Follow up with behavioral health clinician on : Two weeks 2. Behavioral recommendations:  -Continue taking Vistaril as prescribed -Continue using self-coping strategies that have remained helpful 3. Referral(s): [place] (In Clinic)  I discussed the assessment and treatment plan with the patient and/or parent/guardian. They were provided an opportunity to ask questions and all were answered. They agreed with the plan and demonstrated an understanding of the instructions.   They were advised to call back or seek an in-person evaluation if the symptoms worsen or if the condition fails to improve as anticipated.  Christine Kaiser

## 2019-11-09 NOTE — Progress Notes (Signed)
TELEHEALTH OBSTETRICS PRENATAL VIRTUAL VIDEO VISIT ENCOUNTER NOTE  Provider [facility]: [facility]   I connected with [name] on 11/09/19 at  3:00 PM EST by WebEx OB Video Encounter at home and verified that I am speaking with the correct person using two identifiers.   I discussed the limitations, risks, security and privacy concerns of performing an evaluation and management service virtually and the availability of in person appointments. I also discussed with the patient that there may be a patient responsible charge related to this service. The patient expressed understanding and agreed to proceed. Subjective:  Christine FirstJustine E Fiddler is a 28 y.o. G2P1001 at 4254w5d being seen today for ongoing prenatal care.  She is currently monitored for the following issues for this high-risk pregnancy and has Supervision of other normal pregnancy, antepartum; Tobacco smoking affecting pregnancy, antepartum; Cystocele with prolapse; Rubella non-immune status, antepartum; and Placenta previa antepartum in second trimester on their problem list.  Patient reports backache and heartburn.  Contractions: Not present. Vag. Bleeding: None.  Movement: Present. Denies any leaking of fluid.   The following portions of the patient's history were reviewed and updated as appropriate: allergies, current medications, past family history, past medical history, past social history, past surgical history and problem list.   Objective:   Vitals:   11/09/19 1452  BP: 136/78  Pulse: (!) 104    Fetal Status:     Movement: Present     General:  Alert, oriented and cooperative. Patient is in no acute distress.  Respiratory: Normal respiratory effort, no problems with respiration noted  Mental Status: Normal mood and affect. Normal behavior. Normal judgment and thought content.  Rest of physical exam deferred due to type of encounter  Imaging: [place] Mfm Ob Detail +14 Wk  Result Date:  10/14/2019 ----------------------------------------------------------------------  OBSTETRICS REPORT                       (Signed Final 10/14/2019 03:09 pm) ---------------------------------------------------------------------- Patient Info  ID #:       [id number]                          D.O.B.:  1990-12-09 (28 yrs)  Name:       Christine Kaiser               Visit Date: 10/14/2019 01:53 pm ---------------------------------------------------------------------- Performed By  Performed By:     Percell BostonHeather Waken          Ref. Address:     [address]                    [address]                                                             [address]                                                             [phone]  [place]                                                             [phone]  Attending:        Ma RingsVictor Fang MD         [facility]  Referred By:      [facility] ---------------------------------------------------------------------- Orders   #  Description                          Code         Ordered By   1  US MFM OB DETAIL +14 WK              76811.01     LISA LEFTWICH-                                                        KIRBY  ----------------------------------------------------------------------   #  Order #                    Accession #                 Episode #   1  [id number]                  [id number]                  [id number]  ---------------------------------------------------------------------- Indications   Obesity complicating pregnancy, second         O99.212   trimester   [redacted] weeks gestation of pregnancy                Z3A.19   Encounter for antenatal screening for          Z36.3   malformations(neg AFP, low risk NIPS,   9.6FF)  ---------------------------------------------------------------------- Fetal Evaluation  Num Of Fetuses:         1   Fetal Heart Rate(bpm):  140  Cardiac Activity:       Observed  Presentation:           Breech  Placenta:               Posterior, low-lying,1.6 cm from int os  P. Cord Insertion:      Visualized, central  Amniotic Fluid  AFI FV:      Within normal limits                              Largest Pocket(cm)  3.23 ---------------------------------------------------------------------- Biometry  BPD:      40.1  mm     G. Age:  18w 1d         17  %    CI:         68.1   %    70 - 86                                                          FL/HC:      17.1   %    16.1 - 18.3  HC:      155.4  mm     G. Age:  18w 3d         19  %    HC/AC:      1.19        1.09 - 1.39  AC:      130.8  mm     G. Age:  18w 4d         32  %    FL/BPD:     66.3   %  FL:       26.6  mm     G. Age:  18w 1d         15  %    FL/AC:      20.3   %    20 - 24  HUM:      26.6  mm     G. Age:  18w 3d         34  %  CER:      20.3  mm     G. Age:  19w 2d         57  %  NFT:       2.8  mm  CM:        5.1  mm  Est. FW:     237  gm      0 lb 8 oz     15  % ---------------------------------------------------------------------- OB History  Gravidity:    2         Term:   1        Prem:   0        SAB:   0  TOP:          0       Ectopic:  0        Living: 1 ---------------------------------------------------------------------- Gestational Age  LMP:           18w 3d        Date:  06/07/19                 EDD:   03/13/20  U/S Today:     18w 2d                                        EDD:   03/14/20  Best:          19w 0d     Det. ByLoman Chroman         EDD:   03/09/20                                      (  09/08/19) ---------------------------------------------------------------------- Anatomy  Cranium:               Appears normal         Aortic Arch:            Not well visualized  Cavum:                 Appears normal         Ductal Arch:            Appears normal  Ventricles:            Appears normal         Diaphragm:               Appears normal  Choroid Plexus:        Appears normal         Stomach:                Appears normal, left                                                                        sided  Cerebellum:            Appears normal         Abdomen:                Appears normal  Posterior Fossa:       Appears normal         Abdominal Wall:         Appears nml (cord                                                                        insert, abd wall)  Nuchal Fold:           Appears normal         Cord Vessels:           Appears normal (3                                                                        vessel cord)  Face:                  Not well visualized    Kidneys:                Appear normal  Lips:                  Not well visualized    Bladder:                Appears normal  Thoracic:              Appears normal  Spine:                  Appears normal  Heart:                 Not well visualized    Upper Extremities:      Appears normal  RVOT:                  Not well visualized    Lower Extremities:      Appears normal  Other:  Lt heel visualized. 5th digits visualized. Parents do not wish to know          sex of fetus. Technically difficult due to maternal habitus and fetal          position. ---------------------------------------------------------------------- Cervix Uterus Adnexa  Cervix  Length:            3.1  cm.  Normal appearance by transabdominal scan.  Uterus  No abnormality visualized.  Left Ovary  No adnexal mass visualized.  Right Ovary  No adnexal mass visualized.  Cul De Sac  No free fluid seen.  Adnexa  No abnormality visualized. ---------------------------------------------------------------------- Comments  This patient was seen for a detailed fetal anatomy scan due  to maternal obesity. She denies any significant past medical  history and denies any problems in her current pregnancy.  She had a cell free DNA test earlier in her pregnancy which  indicated a low risk for trisomy  49, 37, and 13.  The patient  did not want the fetal gender revealed today.  She was informed that the fetal growth and amniotic fluid  level were appropriate for her gestational age.  The views of the fetal anatomy were limited today due to the  fetal position and maternal body habitus.  The patient was informed that anomalies may be missed due  to technical limitations. If the fetus is in a suboptimal position  or maternal habitus is increased, visualization of the fetus in  the maternal uterus may be impaired.  A partial placenta previa was noted on today's exam.  Precautions due to the placenta previa were discussed today.  A follow-up exam was scheduled in 4 weeks to obtain better  views of the fetal anatomy and to assess the placental  location. ----------------------------------------------------------------------                   Ma Rings, MD Electronically Signed Final Report   10/14/2019 03:09 pm ----------------------------------------------------------------------   Assessment and Plan:  Pregnancy: G2P1001 at [redacted]w[redacted]d 1. Supervision of high risk pregnancy, antepartum  2. Placenta previa antepartum in second trimester - previa precautions - follow up ultrasound at ~ 28 weeks  3. Cystocele affecting pregnancy in second trimester - wears a pessary.  Stable clinically. - any work-up done for collagen vascular disorders?  4. GERD without esophagitis Rx: - famotidine (PEPCID) 20 MG tablet; Take 1 tablet (20 mg total) by mouth 2 (two) times daily.  Dispense: 60 tablet; Refill: 5   Preterm labor symptoms and general obstetric precautions including but not limited to vaginal bleeding, contractions, leaking of fluid and fetal movement were reviewed in detail with the patient. I discussed the assessment and treatment plan with the patient. The patient was provided an opportunity to ask questions and all were answered. The patient agreed with the plan and demonstrated an understanding of the  instructions. The patient was advised to call back or seek an in-person office evaluation/go to [facility] at [facility] for any urgent or concerning symptoms. Please refer to After Visit Summary for other counseling recommendations.   I provided 10 minutes of face-to-face time during this encounter.  Return in about 4 weeks (around 12/07/2019) for MyChart HOB-Faculty Only.  Future Appointments  Date Time Provider Department Center  11/10/2019  2:15 PM [facility] Rosalie Gums [facility]  11/11/2019  1:30 PM WH-MFC [place] 1 WH-MFCUS MFC-US    Coral Ceo, MD Center for [facility], [facility] 11/09/2019

## 2019-11-09 NOTE — Progress Notes (Signed)
Pt has no concerns today.  Has u/s scheduled this week.

## 2019-11-10 ENCOUNTER — Ambulatory Visit (INDEPENDENT_AMBULATORY_CARE_PROVIDER_SITE_OTHER): Payer: Medicare Other | Admitting: Clinical

## 2019-11-10 DIAGNOSIS — F418 Other specified anxiety disorders: Secondary | ICD-10-CM

## 2019-11-10 DIAGNOSIS — F419 Anxiety disorder, unspecified: Secondary | ICD-10-CM

## 2019-11-10 DIAGNOSIS — F4321 Adjustment disorder with depressed mood: Secondary | ICD-10-CM | POA: Diagnosis not present

## 2019-11-10 DIAGNOSIS — O99342 Other mental disorders complicating pregnancy, second trimester: Secondary | ICD-10-CM

## 2019-11-11 ENCOUNTER — Other Ambulatory Visit (HOSPITAL_COMMUNITY): Payer: Self-pay | Admitting: *Deleted

## 2019-11-11 ENCOUNTER — Other Ambulatory Visit: Payer: Self-pay

## 2019-11-11 ENCOUNTER — Ambulatory Visit (HOSPITAL_COMMUNITY)
Admission: RE | Admit: 2019-11-11 | Discharge: 2019-11-11 | Disposition: A | Discharge status: Home / Self Care | Payer: Medicare Other | Source: Ambulatory Visit | Attending: Obstetrics and Gynecology | Admitting: Obstetrics and Gynecology

## 2019-11-11 DIAGNOSIS — Z362 Encounter for other antenatal screening follow-up: Secondary | ICD-10-CM | POA: Diagnosis present

## 2019-11-11 DIAGNOSIS — Z3A23 23 weeks gestation of pregnancy: Secondary | ICD-10-CM

## 2019-11-11 DIAGNOSIS — O99212 Obesity complicating pregnancy, second trimester: Secondary | ICD-10-CM

## 2019-11-17 NOTE — BH Specialist Note (Signed)
Integrated Behavioral Health via Telemedicine Video Visit  11/17/2019 [name] [id number]  Number of [place] visits:  Session Start time: 2:28  Session End time: 2:45 Total time: 17  Referring Provider: Fatima Blank, CNM Type of Visit: Video Patient/Family location: Home [facility] Provider location: [facility] All persons participating in visit: [name] and [place]    Confirmed patient's address: Yes  Confirmed patient's phone number: Yes  Any changes to demographics: No   Confirmed patient's insurance: Yes  Any changes to patient's insurance: No   Discussed confidentiality: At previous visit  I connected with Granville Lewis Alabi  by a video enabled telemedicine application and verified that I am speaking with the correct person using two identifiers.     I discussed the limitations of evaluation and management by telemedicine and the availability of in person appointments.  I discussed that the purpose of this visit is to provide behavioral health care while limiting exposure to the novel coronavirus.   Discussed there is a possibility of technology failure and discussed alternative modes of communication if that failure occurs.  I discussed that engaging in this video visit, they consent to the provision of behavioral healthcare and the services will be billed under their insurance.  Patient and/or legal guardian expressed understanding and consented to video visit: Yes   PRESENTING CONCERNS: Patient and/or family reports the following symptoms/concerns: Pt states she took Vistaril one time and made her too tired; uncertain about continuing. Pt is waking up every few hours at night with overwhelming fatigue. Pt's goal is to gain full custody of her 27yo.  Duration of problem: Over 1.5 month increase; ongoing anxiety; Severity of problem: mild  STRENGTHS (Protective Factors/Coping Skills): Good social support  GOALS  ADDRESSED: Patient will: 1.  Reduce symptoms of: anxiety and stress  2.  Demonstrate ability to: Continue healthy grieving over loss  INTERVENTIONS: Interventions utilized:  Supportive Counseling and Medication Monitoring Standardized Assessments completed: GAD-7 and PHQ 9  ASSESSMENT: Patient currently experiencing Anxiety disorder, unspecified and Grief.   Patient may benefit from continued psychoeducation and brief therapeutic interventions regarding coping with symptoms of anxiety and grief .  PLAN: 1. Follow up with behavioral health clinician on : One month, or earlier, if needed 2. Behavioral recommendations:  -Consider continuing Vistaril as prescribed -Continue using self-coping strategies that have helped in the past -Allow feelings of grief; take naps as needed throughout the day -Accept referrals via [id number] for legal/family law 3. Referral(s): [place] (In Clinic)  I discussed the assessment and treatment plan with the patient and/or parent/guardian. They were provided an opportunity to ask questions and all were answered. They agreed with the plan and demonstrated an understanding of the instructions.   They were advised to call back or seek an in-person evaluation if the symptoms worsen or if the condition fails to improve as anticipated.  [name]  Depression screen [facility] 2/9 11/24/2019  Decreased Interest 0  Down, Depressed, Hopeless 0  PHQ - 2 Score 0  Altered sleeping 3  Tired, decreased energy 3  Change in appetite 0  Feeling bad or failure about yourself  0  Trouble concentrating 0  Moving slowly or fidgety/restless 0  Suicidal thoughts 0  PHQ-9 Score 6   GAD 7 : Generalized Anxiety Score 11/24/2019  Nervous, Anxious, on Edge 1  Control/stop worrying 1  Worry too much - different things 1  Trouble relaxing 1  Restless 0  Easily  annoyed or irritable 1  Afraid - awful might happen 0  Total GAD 7 Score 5

## 2019-11-24 ENCOUNTER — Ambulatory Visit (INDEPENDENT_AMBULATORY_CARE_PROVIDER_SITE_OTHER): Payer: Medicare Other | Admitting: Clinical

## 2019-11-24 ENCOUNTER — Other Ambulatory Visit: Payer: Self-pay

## 2019-11-24 DIAGNOSIS — F419 Anxiety disorder, unspecified: Secondary | ICD-10-CM

## 2019-11-24 DIAGNOSIS — F4321 Adjustment disorder with depressed mood: Secondary | ICD-10-CM

## 2019-11-29 ENCOUNTER — Other Ambulatory Visit (HOSPITAL_COMMUNITY): Payer: Self-pay | Admitting: *Deleted

## 2019-11-29 ENCOUNTER — Encounter (HOSPITAL_COMMUNITY): Payer: Self-pay | Admitting: *Deleted

## 2019-11-29 ENCOUNTER — Ambulatory Visit (HOSPITAL_COMMUNITY)
Admission: RE | Admit: 2019-11-29 | Discharge: 2019-11-29 | Disposition: A | Discharge status: Home / Self Care | Payer: Medicare Other | Source: Ambulatory Visit | Attending: Obstetrics and Gynecology | Admitting: Obstetrics and Gynecology

## 2019-11-29 ENCOUNTER — Ambulatory Visit (HOSPITAL_COMMUNITY): Payer: Medicare Other | Admitting: *Deleted

## 2019-11-29 ENCOUNTER — Other Ambulatory Visit: Payer: Self-pay

## 2019-11-29 DIAGNOSIS — Z362 Encounter for other antenatal screening follow-up: Secondary | ICD-10-CM

## 2019-11-29 DIAGNOSIS — Z283 Underimmunization status: Secondary | ICD-10-CM | POA: Diagnosis present

## 2019-11-29 DIAGNOSIS — O4402 Placenta previa specified as without hemorrhage, second trimester: Secondary | ICD-10-CM | POA: Diagnosis present

## 2019-11-29 DIAGNOSIS — Z3A25 25 weeks gestation of pregnancy: Secondary | ICD-10-CM

## 2019-11-29 DIAGNOSIS — Z2839 Other underimmunization status: Secondary | ICD-10-CM

## 2019-11-29 DIAGNOSIS — Z3689 Encounter for other specified antenatal screening: Secondary | ICD-10-CM

## 2019-11-29 DIAGNOSIS — O99891 Other specified diseases and conditions complicating pregnancy: Secondary | ICD-10-CM | POA: Diagnosis present

## 2019-11-29 DIAGNOSIS — O99332 Smoking (tobacco) complicating pregnancy, second trimester: Secondary | ICD-10-CM | POA: Diagnosis not present

## 2019-11-29 DIAGNOSIS — O99212 Obesity complicating pregnancy, second trimester: Secondary | ICD-10-CM | POA: Diagnosis not present

## 2019-11-29 DIAGNOSIS — O4442 Low lying placenta NOS or without hemorrhage, second trimester: Secondary | ICD-10-CM | POA: Diagnosis not present

## 2019-12-03 NOTE — L&D Delivery Note (Addendum)
OB/GYN Faculty Practice Delivery Note  Christine Kaiser is a 29 y.o. G2P1001 s/p VD at [redacted]w[redacted]d. She was admitted for IOL for GDMA2.   ROM: 4h 39m with clear fluid GBS Status:  Positive/-- (03/11 0315) Maximum Maternal Temperature: 98.4 F  Labor Progress:  Initial SVE: 1/50/-2. Received Cytotec, Foley balloon, Pitocin and AROM.  She then progressed to complete.   Delivery Date/Time: 03/03/20 at 0945 Delivery: Called to room and patient was complete and pushing. Head delivered ROA position. No nuchal cord present. Shoulder and body delivered in usual fashion. Infant with spontaneous cry, placed on mother's abdomen, dried and stimulated. Cord clamped x 2 after 1-minute delay, and cut by maternal aunt. Cord blood drawn. Placenta delivered spontaneously with gentle cord traction. Fundus firm with massage and Pitocin. Labia, perineum, vagina, and cervix inspected inspected with no lacerations.  Baby Weight: pending  Placenta: Sent to L&D Complications: None Lacerations: None EBL: 250 mL Analgesia: Epidural   Infant: APGAR (1 MIN): 8   APGAR (5 MINS): 9   APGAR (10 MINS):     Vondra Brimage, DO 03/03/2020, 10:00 AM  OB FELLOW DELIVERY ATTESTATION  I was gloved and present for the delivery in its entirety, and I agree with the above resident's note.    Jerilynn Birkenhead, MD OB Family Medicine Fellow, [facility] for [employer], [facility]   Jerilynn Birkenhead, MD [facility] Family Medicine Fellow, [facility] for [facility], [facility]

## 2019-12-07 ENCOUNTER — Telehealth (INDEPENDENT_AMBULATORY_CARE_PROVIDER_SITE_OTHER): Payer: Medicare Other | Admitting: Obstetrics & Gynecology

## 2019-12-07 DIAGNOSIS — O99891 Other specified diseases and conditions complicating pregnancy: Secondary | ICD-10-CM

## 2019-12-07 DIAGNOSIS — Z283 Underimmunization status: Secondary | ICD-10-CM

## 2019-12-07 DIAGNOSIS — Z3A26 26 weeks gestation of pregnancy: Secondary | ICD-10-CM

## 2019-12-07 DIAGNOSIS — O4402 Placenta previa specified as without hemorrhage, second trimester: Secondary | ICD-10-CM

## 2019-12-07 DIAGNOSIS — O09899 Supervision of other high risk pregnancies, unspecified trimester: Secondary | ICD-10-CM

## 2019-12-07 NOTE — Progress Notes (Addendum)
I connected with  Christine Kaiser on 12/07/19 by a video enabled telemedicine application and verified that I am speaking with the correct person using two identifiers.   I discussed the limitations of evaluation and management by telemedicine. The patient expressed understanding and agreed to proceed.  ROB.  Reports no complains today.  She is not at home and is unable to check BP.

## 2019-12-07 NOTE — Progress Notes (Signed)
   TELEHEALTH VIRTUAL OBSTETRICS VISIT ENCOUNTER NOTE  I connected with Christine Kaiser on 12/07/19 at  3:00 PM EST by telephone at home and verified that I am speaking with the correct person using two identifiers.   I discussed the limitations, risks, security and privacy concerns of performing an evaluation and management service by telephone and the availability of in person appointments. I also discussed with the patient that there may be a patient responsible charge related to this service. The patient expressed understanding and agreed to proceed.  Subjective:  Christine Kaiser is a 29 y.o. G2P1001 at [redacted]w[redacted]d being followed for ongoing prenatal care.  She is currently monitored for the following issues for this low-risk pregnancy and has Supervision of other normal pregnancy, antepartum; Tobacco smoking affecting pregnancy, antepartum; Cystocele with prolapse; Rubella non-immune status, antepartum; and Placenta previa antepartum in second trimester on their problem list.  Patient reports no complaints. Reports fetal movement. Denies any contractions, bleeding or leaking of fluid.   The following portions of the patient's history were reviewed and updated as appropriate: allergies, current medications, past family history, past medical history, past social history, past surgical history and problem list.   Objective:   General:  Alert, oriented and cooperative.   Mental Status: Normal mood and affect perceived. Normal judgment and thought content.  Rest of physical exam deferred due to type of encounter  Assessment and Plan:  Pregnancy: G2P1001 at [redacted]w[redacted]d 1. Placenta previa antepartum in second trimester 2 hr GTT in 2 weeks  2. Rubella non-immune status, antepartum Rhogam at 28 weeks  Preterm labor symptoms and general obstetric precautions including but not limited to vaginal bleeding, contractions, leaking of fluid and fetal movement were reviewed in detail with the patient.  I  discussed the assessment and treatment plan with the patient. The patient was provided an opportunity to ask questions and all were answered. The patient agreed with the plan and demonstrated an understanding of the instructions. The patient was advised to call back or seek an in-person office evaluation/go to [facility]U at [facility] for any urgent or concerning symptoms. Please refer to After Visit Summary for other counseling recommendations.   I provided 11 minutes of non-face-to-face time during this encounter.  Return in about 2 weeks (around 12/21/2019) for 2 hr GTT.  Future Appointments  Date Time Provider Department Center  12/20/2019  2:00 PM WH-MFC NURSE WH-MFC MFC-US  12/20/2019  2:00 PM WH-MFC [place] 3 WH-MFCUS MFC-US    Scheryl Darter, MD Center for [facility], [facility]

## 2019-12-07 NOTE — Patient Instructions (Signed)

## 2019-12-20 ENCOUNTER — Other Ambulatory Visit (HOSPITAL_COMMUNITY): Payer: Self-pay | Admitting: *Deleted

## 2019-12-20 ENCOUNTER — Ambulatory Visit (HOSPITAL_COMMUNITY): Payer: Medicare Other | Admitting: *Deleted

## 2019-12-20 ENCOUNTER — Other Ambulatory Visit: Payer: Self-pay

## 2019-12-20 ENCOUNTER — Ambulatory Visit (HOSPITAL_COMMUNITY)
Admission: RE | Admit: 2019-12-20 | Discharge: 2019-12-20 | Disposition: A | Discharge status: Home / Self Care | Payer: Medicare Other | Source: Ambulatory Visit | Attending: Obstetrics | Admitting: Obstetrics

## 2019-12-20 ENCOUNTER — Encounter (HOSPITAL_COMMUNITY): Payer: Self-pay

## 2019-12-20 DIAGNOSIS — O99891 Other specified diseases and conditions complicating pregnancy: Secondary | ICD-10-CM | POA: Insufficient documentation

## 2019-12-20 DIAGNOSIS — Z3A28 28 weeks gestation of pregnancy: Secondary | ICD-10-CM

## 2019-12-20 DIAGNOSIS — Z362 Encounter for other antenatal screening follow-up: Secondary | ICD-10-CM

## 2019-12-20 DIAGNOSIS — Z283 Underimmunization status: Secondary | ICD-10-CM

## 2019-12-20 DIAGNOSIS — Z3689 Encounter for other specified antenatal screening: Secondary | ICD-10-CM | POA: Insufficient documentation

## 2019-12-20 DIAGNOSIS — O99332 Smoking (tobacco) complicating pregnancy, second trimester: Secondary | ICD-10-CM | POA: Diagnosis not present

## 2019-12-20 DIAGNOSIS — Z2839 Other underimmunization status: Secondary | ICD-10-CM

## 2019-12-20 DIAGNOSIS — O4402 Placenta previa specified as without hemorrhage, second trimester: Secondary | ICD-10-CM | POA: Diagnosis present

## 2019-12-20 DIAGNOSIS — O99212 Obesity complicating pregnancy, second trimester: Secondary | ICD-10-CM | POA: Diagnosis not present

## 2019-12-20 DIAGNOSIS — Z8759 Personal history of other complications of pregnancy, childbirth and the puerperium: Secondary | ICD-10-CM

## 2019-12-20 DIAGNOSIS — O4442 Low lying placenta NOS or without hemorrhage, second trimester: Secondary | ICD-10-CM

## 2019-12-21 ENCOUNTER — Ambulatory Visit (INDEPENDENT_AMBULATORY_CARE_PROVIDER_SITE_OTHER): Payer: Medicare Other | Admitting: Obstetrics & Gynecology

## 2019-12-21 ENCOUNTER — Other Ambulatory Visit: Payer: Medicare Other

## 2019-12-21 ENCOUNTER — Encounter: Payer: Self-pay | Admitting: Obstetrics & Gynecology

## 2019-12-21 VITALS — BP 113/73 | HR 103 | Wt 202.0 lb

## 2019-12-21 DIAGNOSIS — O24419 Gestational diabetes mellitus in pregnancy, unspecified control: Secondary | ICD-10-CM

## 2019-12-21 DIAGNOSIS — Z3A33 33 weeks gestation of pregnancy: Secondary | ICD-10-CM

## 2019-12-21 DIAGNOSIS — O0990 Supervision of high risk pregnancy, unspecified, unspecified trimester: Secondary | ICD-10-CM

## 2019-12-21 DIAGNOSIS — O0993 Supervision of high risk pregnancy, unspecified, third trimester: Secondary | ICD-10-CM

## 2019-12-21 DIAGNOSIS — Z114 Encounter for screening for human immunodeficiency virus [HIV]: Secondary | ICD-10-CM

## 2019-12-21 NOTE — Progress Notes (Signed)
ROB   CC: None    

## 2019-12-21 NOTE — Progress Notes (Signed)
   PRENATAL VISIT NOTE  Subjective:  Christine Kaiser is a 29 y.o. G2P1001 at [redacted]w[redacted]d being seen today for ongoing prenatal care.  She is currently monitored for the following issues for this low-risk pregnancy and has Supervision of other normal pregnancy, antepartum; Tobacco smoking affecting pregnancy, antepartum; Cystocele with prolapse; Rubella non-immune status, antepartum; and Placenta previa antepartum in second trimester on their problem list.  Patient reports no complaints.  Contractions: Not present. Vag. Bleeding: None.  Movement: Present. Denies leaking of fluid.   The following portions of the patient's history were reviewed and updated as appropriate: allergies, current medications, past family history, past medical history, past social history, past surgical history and problem list.   Objective:   Vitals:   12/21/19 0849  BP: 113/73  Pulse: (!) 103  Weight: 202 lb (91.6 kg)    Fetal Status: Fetal Heart Rate (bpm): 155   Movement: Present     General:  Alert, oriented and cooperative. Patient is in no acute distress.  Skin: Skin is warm and dry. No rash noted.   Cardiovascular: Normal heart rate noted  Respiratory: Normal respiratory effort, no problems with respiration noted  Abdomen: Soft, gravid, appropriate for gestational age.  Pain/Pressure: Present     Pelvic: Cervical exam deferred        Extremities: Normal range of motion.  Edema: None  Mental Status: Normal mood and affect. Normal behavior. Normal judgment and thought content.   Assessment and Plan:  Pregnancy: G2P1001 at [redacted]w[redacted]d 1. Supervision of high risk pregnancy, antepartum Routine 28 week - Glucose Tolerance, 2 Hours w/1 Hour - CBC - RPR - HIV Antibody (routine testing w rflx)  2. Supervision of high risk pregnancy, unspecified, third trimester  Placenta previa resolved - HIV Antibody (routine testing w rflx)  3. Encounter for screening for human immunodeficiency virus (HIV)   - HIV Antibody  (routine testing w rflx)  Preterm labor symptoms and general obstetric precautions including but not limited to vaginal bleeding, contractions, leaking of fluid and fetal movement were reviewed in detail with the patient. Please refer to After Visit Summary for other counseling recommendations.   Return in about 2 weeks (around 01/04/2020) for virtual.  Future Appointments  Date Time Provider Department Center  01/31/2020  3:15 PM WH-[facility]FC [place] 4 [facility]-M[facility]US [facility]-US    Scheryl Darter, MD

## 2019-12-21 NOTE — Patient Instructions (Signed)

## 2019-12-22 LAB — CBC
Hematocrit: 34.9 % (ref 34.0–46.6)
Hemoglobin: 11.7 g/dL (ref 11.1–15.9)
MCH: 29.9 pg (ref 26.6–33.0)
MCHC: 33.5 g/dL (ref 31.5–35.7)
MCV: 89 fL (ref 79–97)
Platelets: 217 10*3/uL (ref 150–450)
RBC: 3.91 x10E6/uL (ref 3.77–5.28)
RDW: 13.1 % (ref 11.7–15.4)
WBC: 16.8 10*3/uL — ABNORMAL HIGH (ref 3.4–10.8)

## 2019-12-22 LAB — GLUCOSE TOLERANCE, 2 HOURS W/ 1HR
Glucose, 1 hour: 182 mg/dL — ABNORMAL HIGH (ref 65–179)
Glucose, 2 hour: 135 mg/dL (ref 65–152)
Glucose, Fasting: 86 mg/dL (ref 65–91)

## 2019-12-22 LAB — HIV ANTIBODY (ROUTINE TESTING W REFLEX): HIV Screen 4th Generation wRfx: NONREACTIVE

## 2019-12-22 LAB — RPR: RPR Ser Ql: NONREACTIVE

## 2019-12-23 DIAGNOSIS — Z8632 Personal history of gestational diabetes: Secondary | ICD-10-CM | POA: Insufficient documentation

## 2019-12-23 DIAGNOSIS — O24419 Gestational diabetes mellitus in pregnancy, unspecified control: Secondary | ICD-10-CM | POA: Insufficient documentation

## 2020-01-04 ENCOUNTER — Telehealth (INDEPENDENT_AMBULATORY_CARE_PROVIDER_SITE_OTHER): Payer: Medicare Other | Admitting: Obstetrics and Gynecology

## 2020-01-04 ENCOUNTER — Encounter: Payer: Self-pay | Admitting: Obstetrics and Gynecology

## 2020-01-04 VITALS — BP 121/70 | HR 113

## 2020-01-04 DIAGNOSIS — O24419 Gestational diabetes mellitus in pregnancy, unspecified control: Secondary | ICD-10-CM

## 2020-01-04 DIAGNOSIS — Z3A30 30 weeks gestation of pregnancy: Secondary | ICD-10-CM

## 2020-01-04 DIAGNOSIS — Z283 Underimmunization status: Secondary | ICD-10-CM

## 2020-01-04 DIAGNOSIS — O99891 Other specified diseases and conditions complicating pregnancy: Secondary | ICD-10-CM

## 2020-01-04 DIAGNOSIS — O4402 Placenta previa specified as without hemorrhage, second trimester: Secondary | ICD-10-CM

## 2020-01-04 DIAGNOSIS — Z3480 Encounter for supervision of other normal pregnancy, unspecified trimester: Secondary | ICD-10-CM

## 2020-01-04 DIAGNOSIS — O09899 Supervision of other high risk pregnancies, unspecified trimester: Secondary | ICD-10-CM

## 2020-01-04 MED ORDER — ACCU-CHEK GUIDE W/DEVICE KIT: 1.0000 | PACK | Freq: Once | 0 refills | Status: AC

## 2020-01-04 MED ORDER — ACCU-CHEK GUIDE VI STRP: ORAL_STRIP | 6 refills | Status: DC

## 2020-01-04 MED ORDER — ACCU-CHEK SOFTCLIX LANCETS MISC: 6 refills | Status: DC

## 2020-01-04 NOTE — Progress Notes (Signed)
Pt states she has been having sharp pain in her groin area, worse the last few days.  Increases with baby movement, hard to lay at night due to pain.

## 2020-01-04 NOTE — Progress Notes (Signed)
TELEHEALTH OBSTETRICS PRENATAL VIRTUAL VIDEO VISIT ENCOUNTER NOTE  Provider location: Center for [facility] at [place]   I connected with Christine Kaiser on 01/04/20 at  4:00 PM EST by MyChart Video Encounter at home and verified that I am speaking with the correct person using two identifiers.   I discussed the limitations, risks, security and privacy concerns of performing an evaluation and management service virtually and the availability of in person appointments. I also discussed with the patient that there may be a patient responsible charge related to this service. The patient expressed understanding and agreed to proceed. Subjective:  Christine Kaiser is a 29 y.o. G2P1001 at 20w5dbeing seen today for ongoing prenatal care.  She is currently monitored for the following issues for this high-risk pregnancy and has Supervision of other normal pregnancy, antepartum; Tobacco smoking affecting pregnancy, antepartum; Cystocele with prolapse; Rubella non-immune status, antepartum; Placenta previa antepartum in second trimester; and Gestational diabetes mellitus (GDM) affecting pregnancy, antepartum on their problem list.  Patient reports general discomforts of pregnancy.  Contractions: Not present. Vag. Bleeding: None.  Movement: Present. Denies any leaking of fluid.   The following portions of the patient's history were reviewed and updated as appropriate: allergies, current medications, past family history, past medical history, past social history, past surgical history and problem list.   Objective:   Vitals:   01/04/20 1351  BP: 121/70  Pulse: (!) 113    Fetal Status:     Movement: Present     General:  Alert, oriented and cooperative. Patient is in no acute distress.  Respiratory: Normal respiratory effort, no problems with respiration noted  Mental Status: Normal mood and affect. Normal behavior. Normal judgment and thought content.  Rest of physical exam deferred due  to type of encounter  Imaging: [place]FM OB FOLLOW UP  Result Date: 12/20/2019 ----------------------------------------------------------------------  OBSTETRICS REPORT                       (Signed Final 12/20/2019 03:20 pm) ---------------------------------------------------------------------- Patient Info  ID #:       [id number]                         D.O.B.:  [birth year](28 yrs)  Name:       Christine Kaiser              Visit Date: 12/20/2019 02:25 pm ---------------------------------------------------------------------- Performed By  Performed By:     HNovella Rob       Ref. Address:     [address]  [place]                                                             [place]  Attending:        Johnell Comings MD         [facility]  Referred By:      [facility] ---------------------------------------------------------------------- Orders   #  Description                          Code         Ordered By   1  [place] MFM OB FOLLOW UP                  [phone]     YU FANG  ----------------------------------------------------------------------   #  Order #                    Accession #                 Episode #   1  [id number]                  [id number]                  [id number]  ---------------------------------------------------------------------- Indications   Low lying placenta, antepartum                 J57.01   Obesity complicating pregnancy, second         O99.212   trimester   Encounter for other antenatal screening        Z36.2   follow-up (low risk NIPS)   Tobacco use complicating pregnancy,            O99.332   second trimester   [redacted] weeks gestation of pregnancy                Z3A.28   ---------------------------------------------------------------------- Vital Signs  Weight (lb): 182                               Height:        4'11"  BMI:         36.76 ---------------------------------------------------------------------- Fetal Evaluation  Num Of Fetuses:         1  Fetal Heart Rate(bpm):  135  Cardiac Activity:       Observed  Presentation:           Cephalic  Placenta:               Posterior  Amniotic Fluid  AFI FV:      Within normal limits  AFI Sum(cm)     %Tile       Largest Pocket(cm)  14.[place]  46          4.77  RUQ(cm)       RLQ(cm)       LUQ(cm)        LLQ(cm)  2.9           3.63          2.81           4.77  Comment:    Resolved Low Lying Placenta ---------------------------------------------------------------------- Biometry  BPD:        69  mm     G. Age:  27w 5d         15  %    CI:        70.68   %    70 - 86                                                          FL/HC:      19.5   %    19.6 - 20.8  HC:      261.6  mm     G. Age:  28w 3d         16  %    HC/AC:      1.08        0.99 - 1.21  AC:      242.6  mm     G. Age:  28w 4d         41  %    FL/BPD:     73.9   %    71 - 87  FL:         51  mm     G. Age:  27w 2d          8  %    FL/AC:      21.0   %    20 - 24  LV:        5.2  mm  Est. FW:    1168  gm      2 lb 9 oz     20  % ---------------------------------------------------------------------- OB History  Gravidity:    2         Term:   1        Prem:   0        SAB:   0  TOP:          0       Ectopic:  0        Living: 1 ---------------------------------------------------------------------- Gestational Age  LMP:           28w 0d        Date:  06/07/19                 EDD:   03/13/20  U/S Today:     28w 0d                                        EDD:   03/13/20  Best:          28w 4d     Det. ByLoman Chroman         EDD:   03/09/20                                      (  09/08/19) ---------------------------------------------------------------------- Anatomy   Cranium:               Appears normal         Aortic Arch:            Previously seen  Cavum:                 Appears normal         Ductal Arch:            Previously seen  Ventricles:            Appears normal         Diaphragm:              Appears normal  Choroid Plexus:        Previously seen        Stomach:                Appears normal, left                                                                        sided  Cerebellum:            Previously seen        Abdomen:                Appears normal  Posterior Fossa:       Previously seen        Abdominal Wall:         Previously seen  Nuchal Fold:           Previously seen        Cord Vessels:           Previously seen  Face:                  Orbits and profile     Kidneys:                Appear normal                         previously seen  Lips:                  Previously seen        Bladder:                Appears normal  Thoracic:              Appears normal         Spine:                  Previously seen  Heart:                 Appears normal         Upper Extremities:      Previously seen                         (4CH, axis, and                         situs)  RVOT:  Previously seen        Lower Extremities:      Previously seen  LVOT:                  Previously seen  Other:  Lt heel previously visualized. 5th digits previously visualized. Parents          do not wish to know sex of fetus. Technically difficult due to maternal          habitus and fetal position. 3VV visualized. ---------------------------------------------------------------------- Cervix Uterus Adnexa  Cervix  Length:           4.43  cm.  Normal appearance by transabdominal scan. ---------------------------------------------------------------------- Comments  The patient has been followed for placenta previa and  maternal obesity. She denies any problems since her last  visit.  The fetal growth and amniotic fluid level were appropriate for  her gestational age  The  previously noted low-lying placenta/placenta previa has  resolved.  The patient was advised to continue routine  antepartum care.  Due to her history of having a smaller size baby, a follow-up  growth scan was scheduled in 6 weeks. ----------------------------------------------------------------------                   Christine Comings, MD Electronically Signed Final Report   12/20/2019 03:20 pm ----------------------------------------------------------------------   Assessment and Plan:  Pregnancy: G2P1001 at 42w5d1. Supervision of other normal pregnancy, antepartum Stable  2. Gestational diabetes mellitus (GDM) affecting pregnancy, antepartum GDM reviewed with pt CBG monitoring and goals reviewed GDM and pregnancy/infant risks reviewed Importance of glycemic control to reduce risks reviewed  - Blood Glucose Monitoring Suppl (ACCU-CHEK GUIDE) w/Device KIT; 1 kit by Does not apply route once for 1 dose.  Dispense: 1 kit; Refill: 0 - glucose blood (ACCU-CHEK GUIDE) test strip; Use 1 test strip each to check glucose 4 times daily  Dispense: 100 each; Refill: 6 - Accu-Chek Softclix Lancets lancets; Use one lancet each to check blood glucose 4 times daily  Dispense: 100 each; Refill: 6 - Referral to [facility]  3. Placenta previa antepartum in second trimester Resolved on last U/S  4. Rubella non-immune status, antepartum Vaccine PP  Preterm labor symptoms and general obstetric precautions including but not limited to vaginal bleeding, contractions, leaking of fluid and fetal movement were reviewed in detail with the patient. I discussed the assessment and treatment plan with the patient. The patient was provided an opportunity to ask questions and all were answered. The patient agreed with the plan and demonstrated an understanding of the instructions. The patient was advised to call back or seek an in-person office evaluation/[facility]. Please refer to After Visit Summary for other counseling recommendations.   I provided 8 minutes of face-to-face time during this encounter.  Return in about 2 weeks (around 01/18/2020) for OB visit, face to face, f/u CBG's, MD provider.  Future Appointments  Date Time Provider [place] 01/04/2020  4:00 PM EChancy Milroy [place]  01/31/2020  3:15 PM [place]  01/31/2020  3:15 PM [place] [facility] [place]

## 2020-01-18 ENCOUNTER — Ambulatory Visit (INDEPENDENT_AMBULATORY_CARE_PROVIDER_SITE_OTHER): Payer: Medicare Other | Admitting: Obstetrics and Gynecology

## 2020-01-18 ENCOUNTER — Encounter: Payer: Self-pay | Admitting: Obstetrics and Gynecology

## 2020-01-18 ENCOUNTER — Other Ambulatory Visit: Payer: Self-pay

## 2020-01-18 VITALS — BP 104/72 | HR 108 | Wt 207.0 lb

## 2020-01-18 DIAGNOSIS — O24419 Gestational diabetes mellitus in pregnancy, unspecified control: Secondary | ICD-10-CM

## 2020-01-18 DIAGNOSIS — Z3480 Encounter for supervision of other normal pregnancy, unspecified trimester: Secondary | ICD-10-CM

## 2020-01-18 DIAGNOSIS — Z3A34 34 weeks gestation of pregnancy: Secondary | ICD-10-CM

## 2020-01-18 DIAGNOSIS — O4402 Placenta previa specified as without hemorrhage, second trimester: Secondary | ICD-10-CM

## 2020-01-18 DIAGNOSIS — N814 Uterovaginal prolapse, unspecified: Secondary | ICD-10-CM

## 2020-01-18 NOTE — Progress Notes (Signed)
   PRENATAL VISIT NOTE  Subjective:  Christine Kaiser is a 29 y.o. G2P1001 at [redacted]w[redacted]d being seen today for ongoing prenatal care.  She is currently monitored for the following issues for this high-risk pregnancy and has Supervision of other normal pregnancy, antepartum; Tobacco smoking affecting pregnancy, antepartum; Cystocele with prolapse; Rubella non-immune status, antepartum; Placenta previa antepartum in second trimester; and Gestational diabetes mellitus (GDM) affecting pregnancy, antepartum on their problem list.  Patient reports no complaints.  Contractions: Not present. Vag. Bleeding: None.  Movement: Present. Denies leaking of fluid.   The following portions of the patient's history were reviewed and updated as appropriate: allergies, current medications, past family history, past medical history, past social history, past surgical history and problem list.   Objective:   Vitals:   01/18/20 1511  BP: 104/72  Pulse: (!) 108  Weight: 207 lb (93.9 kg)    Fetal Status: Fetal Heart Rate (bpm): 130 Fundal Height: 33 cm Movement: Present     General:  Alert, oriented and cooperative. Patient is in no acute distress.  Skin: Skin is warm and dry. No rash noted.   Cardiovascular: Normal heart rate noted  Respiratory: Normal respiratory effort, no problems with respiration noted  Abdomen: Soft, gravid, appropriate for gestational age.  Pain/Pressure: Present     Pelvic: Cervical exam deferred        Extremities: Normal range of motion.     Mental Status: Normal mood and affect. Normal behavior. Normal judgment and thought content.   Assessment and Plan:  Pregnancy: G2P1001 at [redacted]w[redacted]d 1. Supervision of other normal pregnancy, antepartum Patient is doing well without complaints Follow up growth ultrasound 3/1 Patient undecided on contraception and is considering condoms  2. Gestational diabetes mellitus (GDM) affecting pregnancy, antepartum CBGs reviewed and all elevated Scheduled  to meet diabetic educator tomorrow Will return in 2 weeks for CBG review  3. Cystocele with prolapse Follow up with PT post partum  4. Placenta previa antepartum in second trimester resolved  Preterm labor symptoms and general obstetric precautions including but not limited to vaginal bleeding, contractions, leaking of fluid and fetal movement were reviewed in detail with the patient. Please refer to After Visit Summary for other counseling recommendations.   Return in about 2 weeks (around 02/01/2020) for ROB, High risk, in person.  Future Appointments  Date Time Provider Department Center  01/19/2020  3:15 PM NDM-NMCH GDM CLASS NDM-NMCH NDM  01/31/2020  3:15 PM WH-MFC NURSE WH-MFC MFC-US  01/31/2020  3:15 PM WH-MFC [place] 4 WH-MFCUS MFC-US    Catalina Antigua, MD

## 2020-01-18 NOTE — Progress Notes (Signed)
Pt states she is still making dietary changes for glucose management.  Pt has nutrition appt tomorrow.

## 2020-01-19 ENCOUNTER — Other Ambulatory Visit: Payer: Self-pay

## 2020-01-19 ENCOUNTER — Encounter: Payer: Medicare Other | Attending: Advanced Practice Midwife | Admitting: Registered"

## 2020-01-19 DIAGNOSIS — O24419 Gestational diabetes mellitus in pregnancy, unspecified control: Secondary | ICD-10-CM | POA: Diagnosis not present

## 2020-01-21 ENCOUNTER — Encounter: Payer: Self-pay | Admitting: Registered"

## 2020-01-21 NOTE — Progress Notes (Signed)
Patient was seen on 01/21/20 for Gestational Diabetes self-management class [facility]. The following learning objectives were met by the patient during this course:   States the definition of Gestational Diabetes  States why dietary management is important in controlling blood glucose  Describes the effects each nutrient has on blood glucose levels  Demonstrates ability to create a balanced meal plan  Demonstrates carbohydrate counting   States when to check blood glucose levels  Demonstrates proper blood glucose monitoring techniques  States the effect of stress and exercise on blood glucose levels  States the importance of limiting caffeine and abstaining from alcohol and smoking  Blood glucose monitor given: none  Patient instructed to monitor glucose levels: FBS: 60 - <95; 1 hour: <140; 2 hour: <120  Patient received handouts:  Nutrition Diabetes and Pregnancy, including carb counting list  Patient will be seen for follow-up as needed.

## 2020-01-31 ENCOUNTER — Other Ambulatory Visit: Payer: Self-pay

## 2020-01-31 ENCOUNTER — Encounter (HOSPITAL_COMMUNITY): Payer: Self-pay | Admitting: *Deleted

## 2020-01-31 ENCOUNTER — Ambulatory Visit (HOSPITAL_COMMUNITY): Payer: Medicare Other | Admitting: *Deleted

## 2020-01-31 ENCOUNTER — Ambulatory Visit (HOSPITAL_COMMUNITY)
Admission: RE | Admit: 2020-01-31 | Discharge: 2020-01-31 | Disposition: A | Discharge status: Home / Self Care | Payer: Medicare Other | Source: Ambulatory Visit | Attending: Obstetrics | Admitting: Obstetrics

## 2020-01-31 DIAGNOSIS — Z362 Encounter for other antenatal screening follow-up: Secondary | ICD-10-CM

## 2020-01-31 DIAGNOSIS — O99891 Other specified diseases and conditions complicating pregnancy: Secondary | ICD-10-CM | POA: Insufficient documentation

## 2020-01-31 DIAGNOSIS — O99333 Smoking (tobacco) complicating pregnancy, third trimester: Secondary | ICD-10-CM | POA: Diagnosis not present

## 2020-01-31 DIAGNOSIS — Z3A34 34 weeks gestation of pregnancy: Secondary | ICD-10-CM

## 2020-01-31 DIAGNOSIS — O4402 Placenta previa specified as without hemorrhage, second trimester: Secondary | ICD-10-CM | POA: Insufficient documentation

## 2020-01-31 DIAGNOSIS — Z283 Underimmunization status: Secondary | ICD-10-CM | POA: Diagnosis present

## 2020-01-31 DIAGNOSIS — O24419 Gestational diabetes mellitus in pregnancy, unspecified control: Secondary | ICD-10-CM

## 2020-01-31 DIAGNOSIS — Z8759 Personal history of other complications of pregnancy, childbirth and the puerperium: Secondary | ICD-10-CM | POA: Insufficient documentation

## 2020-01-31 DIAGNOSIS — O99213 Obesity complicating pregnancy, third trimester: Secondary | ICD-10-CM | POA: Diagnosis not present

## 2020-01-31 DIAGNOSIS — Z2839 Other underimmunization status: Secondary | ICD-10-CM

## 2020-02-01 ENCOUNTER — Encounter: Payer: Medicare Other | Admitting: Obstetrics and Gynecology

## 2020-02-07 ENCOUNTER — Encounter: Payer: Medicare Other | Admitting: Obstetrics and Gynecology

## 2020-02-10 ENCOUNTER — Other Ambulatory Visit (HOSPITAL_COMMUNITY)
Admission: RE | Admit: 2020-02-10 | Discharge: 2020-02-10 | Disposition: A | Discharge status: Home / Self Care | Payer: Medicare Other | Source: Ambulatory Visit | Attending: Obstetrics & Gynecology | Admitting: Obstetrics & Gynecology

## 2020-02-10 ENCOUNTER — Other Ambulatory Visit: Payer: Self-pay | Admitting: Obstetrics and Gynecology

## 2020-02-10 ENCOUNTER — Ambulatory Visit (INDEPENDENT_AMBULATORY_CARE_PROVIDER_SITE_OTHER): Payer: Medicare Other | Admitting: Obstetrics and Gynecology

## 2020-02-10 ENCOUNTER — Encounter: Payer: Self-pay | Admitting: Obstetrics and Gynecology

## 2020-02-10 ENCOUNTER — Other Ambulatory Visit: Payer: Self-pay

## 2020-02-10 VITALS — BP 125/80 | HR 80 | Wt 212.6 lb

## 2020-02-10 DIAGNOSIS — O99891 Other specified diseases and conditions complicating pregnancy: Secondary | ICD-10-CM

## 2020-02-10 DIAGNOSIS — O24419 Gestational diabetes mellitus in pregnancy, unspecified control: Secondary | ICD-10-CM

## 2020-02-10 DIAGNOSIS — O09899 Supervision of other high risk pregnancies, unspecified trimester: Secondary | ICD-10-CM

## 2020-02-10 DIAGNOSIS — Z3480 Encounter for supervision of other normal pregnancy, unspecified trimester: Secondary | ICD-10-CM | POA: Diagnosis present

## 2020-02-10 DIAGNOSIS — Z3A36 36 weeks gestation of pregnancy: Secondary | ICD-10-CM

## 2020-02-10 DIAGNOSIS — Z283 Underimmunization status: Secondary | ICD-10-CM

## 2020-02-10 MED ORDER — METFORMIN HCL 500 MG PO TABS: 500.0000 mg | ORAL_TABLET | Freq: Every day | ORAL | 0 refills | Status: DC

## 2020-02-10 NOTE — Progress Notes (Signed)
   PRENATAL VISIT NOTE  Subjective:  Christine Kaiser is a 29 y.o. G2P1001 at [redacted]w[redacted]d being seen today for ongoing prenatal care.  She is currently monitored for the following issues for this high-risk pregnancy and has Supervision of other normal pregnancy, antepartum; Tobacco smoking affecting pregnancy, antepartum; Cystocele with prolapse; Rubella non-immune status, antepartum; Placenta previa antepartum in second trimester; and Gestational diabetes mellitus (GDM) affecting pregnancy, antepartum on their problem list.  Patient reports leakage of fluid for the past 2 days with decrease fetal movement.  Contractions: Irregular. Vag. Bleeding: None.  Movement: Present(Simultaneous filing. User may not have seen previous data.). Denies leaking of fluid.   The following portions of the patient's history were reviewed and updated as appropriate: allergies, current medications, past family history, past medical history, past social history, past surgical history and problem list.   Objective:   Vitals:   02/10/20 1448  BP: 125/80  Pulse: 80  Weight: 212 lb 9.6 oz (96.4 kg)    Fetal Status: Fetal Heart Rate (bpm): 160(Simultaneous filing. User may not have seen previous data.) Fundal Height: 36 cm Movement: Present(Simultaneous filing. User may not have seen previous data.)  Presentation: Vertex  General:  Alert, oriented and cooperative. Patient is in no acute distress.  Skin: Skin is warm and dry. No rash noted.   Cardiovascular: Normal heart rate noted  Respiratory: Normal respiratory effort, no problems with respiration noted  Abdomen: Soft, gravid, appropriate for gestational age.  Pain/Pressure: Present     Pelvic: Cervical exam performed Dilation: Closed Effacement (%): Thick Station: Ballotable  Extremities: Normal range of motion.     Mental Status: Normal mood and affect. Normal behavior. Normal judgment and thought content.   Assessment and Plan:  Pregnancy: G2P1001 at [redacted]w[redacted]d 1.  Supervision of other normal pregnancy, antepartum Patient is doing well Cultures collected SSE exam performed and ruled out rupture of membrane: negative pooling, neg nitrazine  2. Gestational diabetes mellitus (GDM) affecting pregnancy, antepartum CBGs reviewed and fasting as high as 110 and most postprandial in 120 but a few as high as 135. Patient states she tends to overeat with dinner Will start metformin 500 with dinner. Discussed protein rich snacks between meals  3. Rubella non-immune status, antepartum Offer pp  Preterm labor symptoms and general obstetric precautions including but not limited to vaginal bleeding, contractions, leaking of fluid and fetal movement were reviewed in detail with the patient. Please refer to After Visit Summary for other counseling recommendations.   Return in about 1 week (around 02/17/2020) for ROB, High risk, Virtual.  Future Appointments  Date Time Provider Department Center  02/10/2020  3:15 PM Zala Degrasse, Gigi Gin, MD [facility] None  02/16/2020  4:15 PM Keeton Kassebaum, Gigi Gin, MD [facility] None    Catalina Antigua, MD

## 2020-02-10 NOTE — Progress Notes (Signed)
Pt is here for ROB, [redacted]w[redacted]d.  

## 2020-02-11 LAB — CERVICOVAGINAL ANCILLARY ONLY
Chlamydia: NEGATIVE
Comment: NEGATIVE
Comment: NORMAL
Neisseria Gonorrhea: NEGATIVE

## 2020-02-13 LAB — CULTURE, BETA STREP (GROUP B ONLY): Strep Gp B Culture: POSITIVE — AB

## 2020-02-14 ENCOUNTER — Encounter: Payer: Self-pay | Admitting: Obstetrics and Gynecology

## 2020-02-14 DIAGNOSIS — B951 Streptococcus, group B, as the cause of diseases classified elsewhere: Secondary | ICD-10-CM | POA: Insufficient documentation

## 2020-02-16 ENCOUNTER — Encounter: Payer: Self-pay | Admitting: Obstetrics and Gynecology

## 2020-02-16 ENCOUNTER — Telehealth (INDEPENDENT_AMBULATORY_CARE_PROVIDER_SITE_OTHER): Payer: Medicare Other | Admitting: Obstetrics and Gynecology

## 2020-02-16 DIAGNOSIS — O09899 Supervision of other high risk pregnancies, unspecified trimester: Secondary | ICD-10-CM

## 2020-02-16 DIAGNOSIS — O4402 Placenta previa specified as without hemorrhage, second trimester: Secondary | ICD-10-CM

## 2020-02-16 DIAGNOSIS — O99820 Streptococcus B carrier state complicating pregnancy: Secondary | ICD-10-CM

## 2020-02-16 DIAGNOSIS — Z3480 Encounter for supervision of other normal pregnancy, unspecified trimester: Secondary | ICD-10-CM

## 2020-02-16 DIAGNOSIS — O24415 Gestational diabetes mellitus in pregnancy, controlled by oral hypoglycemic drugs: Secondary | ICD-10-CM

## 2020-02-16 DIAGNOSIS — B951 Streptococcus, group B, as the cause of diseases classified elsewhere: Secondary | ICD-10-CM

## 2020-02-16 DIAGNOSIS — Z283 Underimmunization status: Secondary | ICD-10-CM

## 2020-02-16 DIAGNOSIS — O24419 Gestational diabetes mellitus in pregnancy, unspecified control: Secondary | ICD-10-CM

## 2020-02-16 DIAGNOSIS — Z3A36 36 weeks gestation of pregnancy: Secondary | ICD-10-CM

## 2020-02-16 NOTE — Progress Notes (Signed)
Pt is unable to take BP at time of intake. Pt states she is doing well.

## 2020-02-16 NOTE — Progress Notes (Signed)
TELEHEALTH OBSTETRICS PRENATAL VIRTUAL VIDEO VISIT ENCOUNTER NOTE  Provider location: [facility] [place]   I connected with Christine Kaiser on 02/16/20 at  4:15 PM EDT by MyChart Video Encounter at home and verified that I am speaking with the correct person using two identifiers.   I discussed the limitations, risks, security and privacy concerns of performing an evaluation and management service virtually and the availability of in person appointments. I also discussed with the patient that there may be a patient responsible charge related to this service. The patient expressed understanding and agreed to proceed. Subjective:  Christine Kaiser is a 29 y.o. G2P1001 at [redacted]w[redacted]d being seen today for ongoing prenatal care.  She is currently monitored for the following issues for this high-risk pregnancy and has Supervision of other normal pregnancy, antepartum; Tobacco smoking affecting pregnancy, antepartum; Cystocele with prolapse; Rubella non-immune status, antepartum; Placenta previa antepartum in second trimester; Gestational diabetes mellitus (GDM) affecting pregnancy, antepartum; and Positive GBS test on their problem list.  Patient reports no complaints.  Contractions: Not present. Vag. Bleeding: None.  Movement: Present. Denies any leaking of fluid.   The following portions of the patient's history were reviewed and updated as appropriate: allergies, current medications, past family history, past medical history, past social history, past surgical history and problem list.   Objective:  There were no vitals filed for this visit.  Fetal Status:     Movement: Present     General:  Alert, oriented and cooperative. Patient is in no acute distress.  Respiratory: Normal respiratory effort, no problems with respiration noted  Mental Status: Normal mood and affect. Normal behavior. Normal judgment and thought content.  Rest of physical exam deferred due to type of  encounter  Imaging: [place] MFM OB FOLLOW UP  Result Date: 02/01/2020 ----------------------------------------------------------------------  OBSTETRICS REPORT                       (Signed Final 02/01/2020 12:54 pm) ---------------------------------------------------------------------- Patient Info  ID #:       [id number]                          D.O.B.:  Mar 16, 1991 (28 yrs)  Name:       Christine Kaiser               Visit Date: 01/31/2020 03:57 pm ---------------------------------------------------------------------- Performed By  Performed By:     Emeline Darling BS,      Ref. Address:     [address]                                                             [phone]  [place]                                                             [place]  Attending:        Sander Nephew      [facility]  Referred By:      [place] ---------------------------------------------------------------------- Orders   #  Description                          Code         Ordered By   1  [place] MFM OB FOLLOW UP                  [phone]     YU FANG  ----------------------------------------------------------------------   #  Order #                    Accession #                 Episode #   1  [id number]                  [id number]                  [id number]  ---------------------------------------------------------------------- Indications   Obesity complicating pregnancy, third          O99.213   trimester   Gestational diabetes in pregnancy,             O24.419   unspecified control   Tobacco use complicating pregnancy, third      O99.333   trimester   Low Risk NIPS ( Negative AFP)   [redacted] weeks gestation of pregnancy                Z3A.34   ---------------------------------------------------------------------- Vital Signs  Weight (lb): 207                               Height:        4'11"  BMI:         41.8 ---------------------------------------------------------------------- Fetal Evaluation  Num Of Fetuses:         1  Fetal Heart Rate(bpm):  130  Cardiac Activity:       Observed  Presentation:           Cephalic  Placenta:               Posterior  P. Cord Insertion:      Previously Visualized  Amniotic Fluid  AFI FV:      Within normal limits  AFI Sum(cm)     %Tile       Largest Pocket(cm)  9.34            15  4.64  RUQ(cm)                     LUQ(cm)        LLQ(cm)  4.64                        2.28           2.42 ---------------------------------------------------------------------- Biometry  BPD:      84.5  mm     G. Age:  34w 0d         34  %    CI:        77.79   %    70 - 86                                                          FL/HC:      20.5   %    20.1 - 22.3  HC:      303.2  mm     G. Age:  33w 5d          5  %    HC/AC:      0.97        0.93 - 1.11  AC:      312.2  mm     G. Age:  35w 1d         72  %    FL/BPD:     73.7   %    71 - 87  FL:       62.3  mm     G. Age:  32w 2d          3  %    FL/AC:      20.0   %    20 - 24  Est. FW:    2353  gm      5 lb 3 oz     32  % ---------------------------------------------------------------------- OB History  Gravidity:    2         Term:   1        Prem:   0        SAB:   0  TOP:          0       Ectopic:  0        Living: 1 ---------------------------------------------------------------------- Gestational Age  LMP:           34w 0d        Date:  06/07/19                 EDD:   03/13/20  U/S Today:     33w 6d                                        EDD:   03/14/20  Best:          34w 4d     Det. ByMarcella Dubs         EDD:   03/09/20                                      (  09/08/19) ---------------------------------------------------------------------- Anatomy  Cranium:                Appears normal         Aortic Arch:            Previously seen  Cavum:                 Appears normal         Ductal Arch:            Previously seen  Ventricles:            Appears normal         Diaphragm:              Previously seen  Choroid Plexus:        Previously seen        Stomach:                Appears normal, left                                                                        sided  Cerebellum:            Previously seen        Abdomen:                Appears normal  Posterior Fossa:       Previously seen        Abdominal Wall:         Previously seen  Nuchal Fold:           Previously seen        Cord Vessels:           Previously seen  Face:                  Orbits and profile     Kidneys:                Appear normal                         previously seen  Lips:                  Previously seen        Bladder:                Appears normal  Thoracic:              Appears normal         Spine:                  Previously seen  Heart:                 Appears normal         Upper Extremities:      Previously seen                         (4CH, axis, and                         situs)  RVOT:  Previously seen        Lower Extremities:      Previously seen  LVOT:                  Previously seen  Other:  Lt heel, 5th digits, 3VV previously visualized. Parents do not wish to          know sex of fetus. Technically difficult due to maternal habitus. ---------------------------------------------------------------------- Cervix Uterus Adnexa  Cervix  Not visualized (advanced GA >24wks) ---------------------------------------------------------------------- Impression  Follow up growth given a diagnosis of obesity  Normal interval growth  Low risk NIPS  A1GDM  Previously resolved placenta previa ---------------------------------------------------------------------- Recommendations  Follow up growth as clinically indicated.  ----------------------------------------------------------------------               Lin Landsman, MD Electronically Signed Final Report   02/01/2020 12:54 pm ----------------------------------------------------------------------   Assessment and Plan:  Pregnancy: G2P1001 at [redacted]w[redacted]d 1. Supervision of other normal pregnancy, antepartum Patient is doing well without complaints  2. Positive GBS test Patient informed of results and need for prophylaxis in labor  3. Placenta previa antepartum in second trimester Resolved on previous scan  4. Rubella non-immune status, antepartum Will offer pp  5. Gestational diabetes mellitus (GDM) affecting pregnancy, antepartum Patient reports fasting now in mid 90's and pp less than 120. Will continue metformin 500 mg qHS Plan for IOL at 39 weeks  Preterm labor symptoms and general obstetric precautions including but not limited to vaginal bleeding, contractions, leaking of fluid and fetal movement were reviewed in detail with the patient. I discussed the assessment and treatment plan with the patient. The patient was provided an opportunity to ask questions and all were answered. The patient agreed with the plan and demonstrated an understanding of the instructions. The patient was advised to call back or seek an in-person office evaluation/go to [facility] at [facility] for any urgent or concerning symptoms. Please refer to After Visit Summary for other counseling recommendations.   I provided 11 minutes of face-to-face time during this encounter.  No follow-ups on file.  Future Appointments  Date Time Provider Department Center  02/16/2020  4:15 PM Lometa Riggin, Gigi Gin, MD [facility] None    Catalina Antigua, MD [facility], [facility]

## 2020-02-23 ENCOUNTER — Ambulatory Visit (INDEPENDENT_AMBULATORY_CARE_PROVIDER_SITE_OTHER): Payer: Medicare Other | Admitting: Obstetrics & Gynecology

## 2020-02-23 ENCOUNTER — Other Ambulatory Visit: Payer: Self-pay | Admitting: Obstetrics & Gynecology

## 2020-02-23 ENCOUNTER — Other Ambulatory Visit: Payer: Self-pay

## 2020-02-23 ENCOUNTER — Other Ambulatory Visit: Payer: Self-pay | Admitting: Advanced Practice Midwife

## 2020-02-23 VITALS — BP 100/68 | HR 97 | Wt 214.0 lb

## 2020-02-23 DIAGNOSIS — Z3A37 37 weeks gestation of pregnancy: Secondary | ICD-10-CM

## 2020-02-23 DIAGNOSIS — N814 Uterovaginal prolapse, unspecified: Secondary | ICD-10-CM

## 2020-02-23 DIAGNOSIS — B951 Streptococcus, group B, as the cause of diseases classified elsewhere: Secondary | ICD-10-CM

## 2020-02-23 DIAGNOSIS — O24419 Gestational diabetes mellitus in pregnancy, unspecified control: Secondary | ICD-10-CM

## 2020-02-23 DIAGNOSIS — Z3480 Encounter for supervision of other normal pregnancy, unspecified trimester: Secondary | ICD-10-CM

## 2020-02-23 NOTE — Progress Notes (Signed)
   PRENATAL VISIT NOTE  Subjective:  Christine Kaiser is a 29 y.o. G2P1001 at [redacted]w[redacted]d being seen today for ongoing prenatal care.  She is currently monitored for the following issues for this high-risk pregnancy and has Supervision of other normal pregnancy, antepartum; Tobacco smoking affecting pregnancy, antepartum; Cystocele with prolapse; Rubella non-immune status, antepartum; Placenta previa antepartum in second trimester; Gestational diabetes mellitus (GDM) affecting pregnancy, antepartum; and Positive GBS test on their problem list.  Patient reports pressure.  Contractions: Not present. Vag. Bleeding: None.  Movement: Present. Denies leaking of fluid.   The following portions of the patient's history were reviewed and updated as appropriate: allergies, current medications, past family history, past medical history, past social history, past surgical history and problem list.   Objective:   Vitals:   02/23/20 1514  BP: 100/68  Pulse: 97  Weight: 214 lb (97.1 kg)    Fetal Status: Fetal Heart Rate (bpm): 143 Fundal Height: 36 cm Movement: Present     General:  Alert, oriented and cooperative. Patient is in no acute distress.  Skin: Skin is warm and dry. No rash noted.   Cardiovascular: Normal heart rate noted  Respiratory: Normal respiratory effort, no problems with respiration noted  Abdomen: Soft, gravid, appropriate for gestational age.  Pain/Pressure: Present     Pelvic: Cervical exam deferred        Extremities: Normal range of motion.  Edema: None  Mental Status: Normal mood and affect. Normal behavior. Normal judgment and thought content.   Assessment and Plan:  Pregnancy: G2P1001 at [redacted]w[redacted]d 1. Supervision of other normal pregnancy, antepartum IOL 39 weeks  2. Gestational diabetes mellitus (GDM) affecting pregnancy, antepartum Good control on metformin  3. Cystocele with prolapse   4. Positive GBS test Antibiotic prophylaxis  Term labor symptoms and general  obstetric precautions including but not limited to vaginal bleeding, contractions, leaking of fluid and fetal movement were reviewed in detail with the patient. Please refer to After Visit Summary for other counseling recommendations.   Return in about 1 week (around 03/01/2020).  Future Appointments  Date Time Provider Department Center  03/01/2020  3:45 PM Adam Phenix, MD [facility] None    Scheryl Darter, MD

## 2020-02-23 NOTE — Progress Notes (Signed)
ROB. C/o pressure. 

## 2020-02-23 NOTE — Patient Instructions (Signed)

## 2020-02-24 ENCOUNTER — Encounter (HOSPITAL_COMMUNITY): Payer: Self-pay | Admitting: *Deleted

## 2020-02-24 ENCOUNTER — Telehealth (HOSPITAL_COMMUNITY): Payer: Self-pay | Admitting: *Deleted

## 2020-02-24 NOTE — Telephone Encounter (Signed)
Preadmission screen  

## 2020-02-29 ENCOUNTER — Other Ambulatory Visit (HOSPITAL_COMMUNITY)
Admission: RE | Admit: 2020-02-29 | Discharge: 2020-02-29 | Disposition: A | Discharge status: Home / Self Care | Payer: Medicare Other | Source: Ambulatory Visit | Attending: Family Medicine | Admitting: Family Medicine

## 2020-02-29 LAB — SARS CORONAVIRUS 2 (TAT 6-24 HRS): SARS Coronavirus 2: NEGATIVE

## 2020-03-01 ENCOUNTER — Ambulatory Visit (INDEPENDENT_AMBULATORY_CARE_PROVIDER_SITE_OTHER): Payer: Medicare Other | Admitting: Obstetrics & Gynecology

## 2020-03-01 ENCOUNTER — Other Ambulatory Visit: Payer: Self-pay

## 2020-03-01 VITALS — BP 106/73 | HR 111 | Wt 215.0 lb

## 2020-03-01 DIAGNOSIS — O24415 Gestational diabetes mellitus in pregnancy, controlled by oral hypoglycemic drugs: Secondary | ICD-10-CM

## 2020-03-01 DIAGNOSIS — O24419 Gestational diabetes mellitus in pregnancy, unspecified control: Secondary | ICD-10-CM

## 2020-03-01 DIAGNOSIS — Z3A39 39 weeks gestation of pregnancy: Secondary | ICD-10-CM

## 2020-03-01 DIAGNOSIS — Z3480 Encounter for supervision of other normal pregnancy, unspecified trimester: Secondary | ICD-10-CM

## 2020-03-01 DIAGNOSIS — Z283 Underimmunization status: Secondary | ICD-10-CM

## 2020-03-01 NOTE — Progress Notes (Signed)
Patient reports fetal movement with pressure and back pain.

## 2020-03-01 NOTE — Patient Instructions (Signed)

## 2020-03-01 NOTE — Progress Notes (Signed)
   PRENATAL VISIT NOTE  Subjective:  Christine Kaiser is a 29 y.o. G2P1001 at [redacted]w[redacted]d being seen today for ongoing prenatal care.  She is currently monitored for the following issues for this high-risk pregnancy and has Supervision of other normal pregnancy, antepartum; Tobacco smoking affecting pregnancy, antepartum; Cystocele with prolapse; Rubella non-immune status, antepartum; Placenta previa antepartum in second trimester; Gestational diabetes mellitus (GDM) affecting pregnancy, antepartum; and Positive GBS test on their problem list.  Patient reports occasional contractions.  Contractions: Not present. Vag. Bleeding: None.  Movement: Present. Denies leaking of fluid.   The following portions of the patient's history were reviewed and updated as appropriate: allergies, current medications, past family history, past medical history, past social history, past surgical history and problem list.   Objective:   Vitals:   03/01/20 1532  BP: 106/73  Pulse: (!) 111  Weight: 215 lb (97.5 kg)    Fetal Status: Fetal Heart Rate (bpm): 152   Movement: Present     General:  Alert, oriented and cooperative. Patient is in no acute distress.  Skin: Skin is warm and dry. No rash noted.   Cardiovascular: Normal heart rate noted  Respiratory: Normal respiratory effort, no problems with respiration noted  Abdomen: Soft, gravid, appropriate for gestational age.  Pain/Pressure: Present     Pelvic: Cervical exam deferred        Extremities: Normal range of motion.     Mental Status: Normal mood and affect. Normal behavior. Normal judgment and thought content.   Assessment and Plan:  Pregnancy: G2P1001 at [redacted]w[redacted]d 1. Supervision of other normal pregnancy, antepartum IOL 39 weeks  2. Gestational diabetes mellitus (GDM) affecting pregnancy, antepartum Well controlled metformin  Term labor symptoms and general obstetric precautions including but not limited to vaginal bleeding, contractions, leaking of  fluid and fetal movement were reviewed in detail with the patient. Please refer to After Visit Summary for other counseling recommendations.   Return in about 4 weeks (around 03/29/2020) for postpartum.  Future Appointments  Date Time Provider Department Center  03/02/2020  7:15 AM [facility] SCHED ROOM [facility] None    Scheryl Darter, MD

## 2020-03-02 ENCOUNTER — Other Ambulatory Visit: Payer: Self-pay

## 2020-03-02 ENCOUNTER — Encounter (HOSPITAL_COMMUNITY): Payer: Self-pay | Admitting: Obstetrics & Gynecology

## 2020-03-02 ENCOUNTER — Inpatient Hospital Stay (HOSPITAL_COMMUNITY): Payer: Medicare Other

## 2020-03-02 ENCOUNTER — Inpatient Hospital Stay (HOSPITAL_COMMUNITY)
Admission: AD | Admit: 2020-03-02 | Discharge: 2020-03-04 | DRG: 807 | Disposition: A | Discharge status: Home / Self Care | Payer: Medicare Other | Attending: Family Medicine | Admitting: Family Medicine

## 2020-03-02 DIAGNOSIS — O99214 Obesity complicating childbirth: Secondary | ICD-10-CM | POA: Diagnosis present

## 2020-03-02 DIAGNOSIS — Z2839 Other underimmunization status: Secondary | ICD-10-CM

## 2020-03-02 DIAGNOSIS — O24425 Gestational diabetes mellitus in childbirth, controlled by oral hypoglycemic drugs: Principal | ICD-10-CM | POA: Diagnosis present

## 2020-03-02 DIAGNOSIS — O99334 Smoking (tobacco) complicating childbirth: Secondary | ICD-10-CM | POA: Diagnosis present

## 2020-03-02 DIAGNOSIS — O99824 Streptococcus B carrier state complicating childbirth: Secondary | ICD-10-CM | POA: Diagnosis present

## 2020-03-02 DIAGNOSIS — O99330 Smoking (tobacco) complicating pregnancy, unspecified trimester: Secondary | ICD-10-CM | POA: Diagnosis present

## 2020-03-02 DIAGNOSIS — Z20822 Contact with and (suspected) exposure to covid-19: Secondary | ICD-10-CM | POA: Diagnosis present

## 2020-03-02 DIAGNOSIS — Z283 Underimmunization status: Secondary | ICD-10-CM

## 2020-03-02 DIAGNOSIS — Z23 Encounter for immunization: Secondary | ICD-10-CM | POA: Diagnosis not present

## 2020-03-02 DIAGNOSIS — B951 Streptococcus, group B, as the cause of diseases classified elsewhere: Secondary | ICD-10-CM | POA: Diagnosis present

## 2020-03-02 DIAGNOSIS — Z3A39 39 weeks gestation of pregnancy: Secondary | ICD-10-CM

## 2020-03-02 DIAGNOSIS — O09899 Supervision of other high risk pregnancies, unspecified trimester: Secondary | ICD-10-CM

## 2020-03-02 DIAGNOSIS — F17210 Nicotine dependence, cigarettes, uncomplicated: Secondary | ICD-10-CM | POA: Diagnosis present

## 2020-03-02 DIAGNOSIS — Z3490 Encounter for supervision of normal pregnancy, unspecified, unspecified trimester: Secondary | ICD-10-CM

## 2020-03-02 DIAGNOSIS — O24419 Gestational diabetes mellitus in pregnancy, unspecified control: Secondary | ICD-10-CM | POA: Diagnosis present

## 2020-03-02 DIAGNOSIS — Z8632 Personal history of gestational diabetes: Secondary | ICD-10-CM | POA: Diagnosis present

## 2020-03-02 LAB — CBC
HCT: 37.7 % (ref 36.0–46.0)
Hemoglobin: 11.8 g/dL — ABNORMAL LOW (ref 12.0–15.0)
MCH: 27.7 pg (ref 26.0–34.0)
MCHC: 31.3 g/dL (ref 30.0–36.0)
MCV: 88.5 fL (ref 80.0–100.0)
Platelets: 187 10*3/uL (ref 150–400)
RBC: 4.26 MIL/uL (ref 3.87–5.11)
RDW: 14.7 % (ref 11.5–15.5)
WBC: 15.2 10*3/uL — ABNORMAL HIGH (ref 4.0–10.5)
nRBC: 0 % (ref 0.0–0.2)

## 2020-03-02 LAB — TYPE AND SCREEN
ABO/RH(D): O POS
Antibody Screen: NEGATIVE

## 2020-03-02 LAB — GLUCOSE, CAPILLARY
Glucose-Capillary: 74 mg/dL (ref 70–99)
Glucose-Capillary: 101 mg/dL — ABNORMAL HIGH (ref 70–99)
Glucose-Capillary: 78 mg/dL (ref 70–99)
Glucose-Capillary: 83 mg/dL (ref 70–99)

## 2020-03-02 LAB — RPR: RPR Ser Ql: NONREACTIVE

## 2020-03-02 MED ORDER — OXYTOCIN 40 UNITS IN NORMAL SALINE INFUSION - SIMPLE MED
1.0000 m[IU]/min | INTRAVENOUS | Status: DC
  Administered 2020-03-02: 14 m[IU]/min via INTRAVENOUS
  Administered 2020-03-02: 22:00:00 12 m[IU]/min via INTRAVENOUS
  Administered 2020-03-02: 21:00:00 10 m[IU]/min via INTRAVENOUS
  Administered 2020-03-02: 20:00:00 8 m[IU]/min via INTRAVENOUS
  Administered 2020-03-02: 14:00:00 2 m[IU]/min via INTRAVENOUS
  Administered 2020-03-03: 16 m[IU]/min via INTRAVENOUS
  Administered 2020-03-03: 04:00:00 20 m[IU]/min via INTRAVENOUS
  Administered 2020-03-03: 18 m[IU]/min via INTRAVENOUS

## 2020-03-02 MED ORDER — LIDOCAINE HCL (PF) 1 % IJ SOLN: 30.0000 mL | INTRAMUSCULAR | Status: DC | PRN

## 2020-03-02 MED ORDER — DIPHENHYDRAMINE HCL 50 MG/ML IJ SOLN: 12.5000 mg | INTRAMUSCULAR | Status: DC | PRN

## 2020-03-02 MED ORDER — LACTATED RINGERS IV SOLN
500.0000 mL | INTRAVENOUS | Status: DC | PRN
  Administered 2020-03-03: 06:00:00 1000 mL via INTRAVENOUS

## 2020-03-02 MED ORDER — ACETAMINOPHEN 325 MG PO TABS: 650.0000 mg | ORAL_TABLET | ORAL | Status: DC | PRN

## 2020-03-02 MED ORDER — SODIUM CHLORIDE 0.9 % IV SOLN
5.0000 10*6.[IU] | Freq: Once | INTRAVENOUS | Status: AC
  Administered 2020-03-02: 5 10*6.[IU] via INTRAVENOUS
  Filled 2020-03-02: qty 5

## 2020-03-02 MED ORDER — TERBUTALINE SULFATE 1 MG/ML IJ SOLN: 0.2500 mg | Freq: Once | INTRAMUSCULAR | Status: DC | PRN

## 2020-03-02 MED ORDER — OXYTOCIN 40 UNITS IN NORMAL SALINE INFUSION - SIMPLE MED
2.5000 [IU]/h | INTRAVENOUS | Status: DC
  Filled 2020-03-02: qty 1000

## 2020-03-02 MED ORDER — FENTANYL-BUPIVACAINE-NACL 0.5-0.125-0.9 MG/250ML-% EP SOLN
12.0000 mL/h | EPIDURAL | Status: DC | PRN
  Filled 2020-03-02: qty 250

## 2020-03-02 MED ORDER — ONDANSETRON HCL 4 MG/2ML IJ SOLN: 4.0000 mg | Freq: Four times a day (QID) | INTRAMUSCULAR | Status: DC | PRN

## 2020-03-02 MED ORDER — PHENYLEPHRINE 40 MCG/ML (10ML) SYRINGE FOR IV PUSH (FOR BLOOD PRESSURE SUPPORT): 80.0000 ug | PREFILLED_SYRINGE | INTRAVENOUS | Status: DC | PRN

## 2020-03-02 MED ORDER — MISOPROSTOL 50MCG HALF TABLET
50.0000 ug | ORAL_TABLET | ORAL | Status: DC | PRN
  Administered 2020-03-02: 10:00:00 50 ug via ORAL
  Filled 2020-03-02: qty 1

## 2020-03-02 MED ORDER — SOD CITRATE-CITRIC ACID 500-334 MG/5ML PO SOLN: 30.0000 mL | ORAL | Status: DC | PRN

## 2020-03-02 MED ORDER — OXYCODONE-ACETAMINOPHEN 5-325 MG PO TABS: 1.0000 | ORAL_TABLET | ORAL | Status: DC | PRN

## 2020-03-02 MED ORDER — FENTANYL CITRATE (PF) 100 MCG/2ML IJ SOLN
100.0000 ug | INTRAMUSCULAR | Status: DC | PRN
  Administered 2020-03-02 – 2020-03-03 (×7): 100 ug via INTRAVENOUS
  Filled 2020-03-02 (×7): qty 2

## 2020-03-02 MED ORDER — LACTATED RINGERS IV SOLN: INTRAVENOUS | Status: DC

## 2020-03-02 MED ORDER — PENICILLIN G POT IN DEXTROSE 60000 UNIT/ML IV SOLN
3.0000 10*6.[IU] | INTRAVENOUS | Status: DC
  Administered 2020-03-02 – 2020-03-03 (×4): 3 10*6.[IU] via INTRAVENOUS
  Filled 2020-03-02 (×5): qty 50

## 2020-03-02 MED ORDER — EPHEDRINE 5 MG/ML INJ: 10.0000 mg | INTRAVENOUS | Status: DC | PRN

## 2020-03-02 MED ORDER — OXYTOCIN BOLUS FROM INFUSION
500.0000 mL | Freq: Once | INTRAVENOUS | Status: AC
  Administered 2020-03-03: 10:00:00 500 mL via INTRAVENOUS

## 2020-03-02 MED ORDER — OXYCODONE-ACETAMINOPHEN 5-325 MG PO TABS: 2.0000 | ORAL_TABLET | ORAL | Status: DC | PRN

## 2020-03-02 MED ORDER — INFLUENZA VAC SPLIT QUAD 0.5 ML IM SUSY: 0.5000 mL | PREFILLED_SYRINGE | INTRAMUSCULAR | Status: DC

## 2020-03-02 MED ORDER — LACTATED RINGERS IV SOLN: 500.0000 mL | Freq: Once | INTRAVENOUS | Status: DC

## 2020-03-02 NOTE — Progress Notes (Signed)
Patient ID: Christine Kaiser, female   DOB: [birth year], 29 y.o.   MRN: [id number] Still hurting Reluctantant to get epidural  Vitals:   03/02/20 2030 03/02/20 2101 03/02/20 2131 03/02/20 2200  BP: 123/74 119/84 124/80 126/77  Pulse: 93 (!) 102 87 98  Resp: 18 17 18 17   Temp:      TempSrc:      Weight:      Height:       FHR reassuring, 135 with accels UCs every 1.5 min  RN checking cervix now  Will continue plan of care

## 2020-03-02 NOTE — Progress Notes (Addendum)
Labor Progress Note Christine Kaiser is a 29 y.o. G2P1001 at [redacted]w[redacted]d presented for IOL for A2GDM  S:  Comfortable, no c/o.   O:  BP 121/68   Pulse 92   Temp 98.6 F (37 C) (Oral)   Resp 18   Ht 4\' 11"  (1.499 m)   Wt 97.5 kg   LMP 06/07/2019   BMI 43.42 kg/m  EFM: baseline 140 bpm/ mod variability/ + accels/ no decels  Toco/IUPC: irregular SVE: Dilation: 1 Effacement (%): 50 Station: -2 Presentation: Vertex Exam by:: Nyomie Ehrlich, CNM CBG 74  A/P: 29 y.o. G2P1001 [redacted]w[redacted]d  1. Labor: latent 2. FWB: Cat I 3. Pain: analgesia/anesthesia prn 4. A2GDM: stable   Consented for FB, inserted w/o difficulty, pt tolerated well. Will start Cytotec for ripening. CBG q4. Anticipate labor progress and SVD.  [redacted]w[redacted]d, CNM 9:34 AM

## 2020-03-02 NOTE — Progress Notes (Signed)
Labor Progress Note Christine Kaiser is a 29 y.o. G2P1001 at [redacted]w[redacted]d presented for IOL for A2GDM  S:  Comfortable, feeling occasional ctx.   O:  BP 121/69   Pulse 82   Temp 98.2 F (36.8 C) (Oral)   Resp 16   Ht 4\' 11"  (1.499 m)   Wt 97.5 kg   LMP 06/07/2019   BMI 43.42 kg/m  EFM: baseline 140 bpm/ mod variability/ no accels/ no decels  Toco/IUPC: 1.5-3 SVE: Dilation: 4.5 Effacement (%): 50 Station: -2 Presentation: Vertex Exam by:: Dinnis Rog, CNM CBG: 101  A/P: 29 y.o. G2P1001 [redacted]w[redacted]d  1. Labor: latent 2. FWB: Cat I 3. Pain: analgesia/anesthewsia prn 4. A2GDM- stable  S/p foley and Cytotec. Will start Pitocin. Anticipate labor progress and SVD.  [redacted]w[redacted]d, CNM 1:43 PM

## 2020-03-02 NOTE — Progress Notes (Signed)
Labor Progress Note NOHA MILBERGER is a 29 y.o. G2P1001 at [redacted]w[redacted]d presented for IOL for A2GDM  S:  Feeling some ctx, just had dose of Fentanyl which is helping.  O:  BP 131/78   Pulse 84   Temp 98.2 F (36.8 C) (Oral)   Resp 16   Ht 4\' 11"  (1.499 m)   Wt 97.5 kg   LMP 06/07/2019   BMI 43.42 kg/m  EFM: baseline 135 bpm/ mod variability/ + accels/ no decels  Toco/IUPC: 2-3 SVE: Dilation: 5 Effacement (%): 50 Station: -3 Presentation: Vertex Exam by:: Jaymarie Yeakel, CNM Pitocin: 4 mu/min CBG: 78  A/P: 29 y.o. G2P1001 [redacted]w[redacted]d  1. Labor: latent 2. FWB: Cat I 3. Pain: analgesia/anesthesia/NO prn 4. A2GDM- stable  Continue Pitocin titration. Consider AROM. Anticipate labor progress and SVD.  [redacted]w[redacted]d, CNM 5:25 PM

## 2020-03-02 NOTE — H&P (Signed)
OBSTETRIC ADMISSION HISTORY AND PHYSICAL  Christine Kaiser is a 29 y.o. female G2P1001 with IUP at [redacted]w[redacted]d presenting for IOL for A2GDM. She reports +FMs. No LOF, VB, blurry vision, headaches, peripheral edema, or RUQ pain. She plans on formula feeding. She requests BTL for birth control.  Dating: By 13 wk [place] --->  Estimated Date of Delivery: 03/09/20  Sono:    @[redacted]w[redacted]d , normal anatomy, ceph presentation, 2353g, 32%ile, EFW 5'3  Prenatal History/Complications: - A2GDM on Metformin - GBS carrier - placenta previa, resolved - rubella non-immune - cystocele with pessary use - smoker  Past Medical History: Past Medical History:  Diagnosis Date  . Anxiety and depression   . Cystocele, unspecified (CODE)     Past Surgical History: Past Surgical History:  Procedure Laterality Date  . WISDOM TOOTH EXTRACTION      Obstetrical History: OB History    Gravida  2   Para  1   Term  1   Preterm      AB      Living  1     SAB      TAB      Ectopic      Multiple      Live Births  1           Social History: Social History   Socioeconomic History  . Marital status: Single    Spouse name: Not on file  . Number of children: Not on file  . Years of education: Not on file  . Highest education level: Not on file  Occupational History  . Not on file  Tobacco Use  . Smoking status: Current Every Day Smoker    Packs/day: 0.50    Types: Cigarettes  . Smokeless tobacco: Never Used  . Tobacco comment: 1 pack every 3 days   Substance and Sexual Activity  . Alcohol use: Not Currently  . Drug use: Not Currently    Types: Marijuana  . Sexual activity: Yes    Birth control/protection: None  Other Topics Concern  . Not on file  Social History Narrative  . Not on file   Social Determinants of Health   Financial Resource Strain:   . Difficulty of Paying Living Expenses:   Food Insecurity:   . Worried About in the Last Year:   . Programme researcher, broadcasting/film/video  in the Last Year:   Transportation Needs:   . Barista (Medical):   Freight forwarder Lack of Transportation (Non-Medical):   Physical Activity:   . Days of Exercise per Week:   . Minutes of Exercise per Session:   Stress:   . Feeling of Stress :   Social Connections:   . Frequency of Communication with Friends and Family:   . Frequency of Social Gatherings with Friends and Family:   . Attends Religious Services:   . Active Member of Clubs or Organizations:   . Attends [employer] Meetings:   Banker Marital Status:     Family History: Family History  Problem Relation Age of Onset  . Colon cancer Mother   . Sleep apnea Father   . Diabetes Father   . Hyperlipidemia Maternal Grandmother   . Diabetes Maternal Grandmother   . Colon cancer Maternal Grandfather   . Colon cancer Maternal Aunt     Allergies: Allergies  Allergen Reactions  . Coconut Oil     Medications Prior to Admission  Medication Sig Dispense Refill Last Dose  .  Accu-Chek Softclix Lancets lancets Use one lancet each to check blood glucose 4 times daily 100 each 6 03/01/2020 at Unknown time  . famotidine (PEPCID) 20 MG tablet Take 1 tablet (20 mg total) by mouth 2 (two) times daily. 60 tablet 5 03/02/2020 at Unknown time  . glucose blood (ACCU-CHEK GUIDE) test strip Use 1 test strip each to check glucose 4 times daily 100 each 6 03/01/2020 at Unknown time  . hydrOXYzine (VISTARIL) 25 MG capsule Take 1-2 capsules (25-50 mg total) by mouth 3 (three) times daily as needed for anxiety. 30 capsule 0 Past Week at Unknown time  . metFORMIN (GLUCOPHAGE) 500 MG tablet Take 1 tablet (500 mg total) by mouth daily with supper. 30 tablet 0 03/01/2020 at Unknown time  . Prenatal Vit-Fe Phos-FA-Omega (VITAFOL GUMMIES) 3.33-0.333-34.8 MG CHEW Chew 1 tablet by mouth daily. 90 tablet 11 03/01/2020 at Unknown time  . Elastic Bandages & Supports (COMFORT FIT MATERNITY SUPP MED) MISC 1 Device by Does not apply route daily. 1 each 0       Review of Systems:  All systems reviewed and negative except as stated in HPI  PE: Blood pressure 121/68, pulse 92, temperature 98.6 F (37 C), temperature source Oral, resp. rate 18, height 4\' 11"  (1.499 m), weight 97.5 kg, last menstrual period 06/07/2019. General appearance: alert, cooperative and no distress Lungs: regular rate and effort Heart: regular rate  Abdomen: soft, non-tender Extremities: Homans sign is negative, no sign of DVT Presentation: cephalic EFM: 140 bpm, mod variability, + accels, no decels Toco: irregular Dilation: 1 Effacement (%): 50 Station: -2 Exam by:: [email or web address], CNM  Prenatal labs: ABO, Rh: --/--/O POS (04/01 09-01-1994) Antibody: NEG (04/01 09-01-1994) Rubella: <0.90 (10/01 1408) RPR: Non Reactive (01/19 0937)  HBsAg: Negative (10/01 1408)  HIV: Non Reactive (01/19 0937)  GBS: Positive/-- (03/11 0315)  2 hr GTT failed   Prenatal Transfer Tool  Maternal Diabetes: Yes:  Diabetes Type:  Diet controlled Genetic Screening: Normal Maternal Ultrasounds/Referrals: Normal Fetal Ultrasounds or other Referrals:  None Maternal Substance Abuse:  Yes:  Type: Smoker Significant Maternal Medications:  Meds include: Other: Metformin Significant Maternal Lab Results: Group B Strep positive  Results for orders placed or performed during the hospital encounter of 03/02/20 (from the past 24 hour(s))  Type and screen   Collection Time: 03/02/20  8:08 AM  Result Value Ref Range   ABO/RH(D) O POS    Antibody Screen NEG    Sample Expiration      03/05/2020,2359 Performed at [facility], [address], [place], [place]   CBC   Collection Time: 03/02/20  8:32 AM  Result Value Ref Range   WBC 15.2 (H) 4.0 - 10.5 K/uL   RBC 4.26 3.87 - 5.11 MIL/uL   Hemoglobin 11.8 (L) 12.0 - 15.0 g/dL   HCT 05/02/20 40.1 - 02.7 %   MCV 88.5 80.0 - 100.0 fL   MCH 27.7 26.0 - 34.0 pg   MCHC 31.3 30.0 - 36.0 g/dL   RDW 25.3 66.4 - 40.3 %   Platelets 187 150 - 400  K/uL   nRBC 0.0 0.0 - 0.2 %  Glucose, capillary   Collection Time: 03/02/20  9:30 AM  Result Value Ref Range   Glucose-Capillary 74 70 - 99 mg/dL    Patient Active Problem List   Diagnosis Date Noted  . Positive GBS test 02/14/2020  . Gestational diabetes mellitus (GDM) affecting pregnancy, antepartum 12/23/2019  . Placenta previa antepartum in  second trimester 10/19/2019  . Rubella non-immune status, antepartum 09/08/2019  . Cystocele with prolapse 08/31/2019  . Supervision of other normal pregnancy, antepartum 08/17/2019  . Tobacco smoking affecting pregnancy, antepartum 08/17/2019    Assessment: Christine Kaiser is a 29 y.o. G2P1001 at [redacted]w[redacted]d here for IOL  1. Labor: latent 2. FWB: Cat I 3. Pain: analgesia/anesthesia prn 4. GBS: pos 5. A2GDM  Plan: Admit to LD CBG PCN Cervical ripening Anticipate SVD  Julianne Handler, CNM  03/02/2020, 9:39 AM

## 2020-03-03 ENCOUNTER — Encounter (HOSPITAL_COMMUNITY): Payer: Self-pay | Admitting: Obstetrics & Gynecology

## 2020-03-03 ENCOUNTER — Inpatient Hospital Stay (HOSPITAL_COMMUNITY): Payer: Medicare Other | Admitting: Anesthesiology

## 2020-03-03 DIAGNOSIS — O99824 Streptococcus B carrier state complicating childbirth: Secondary | ICD-10-CM

## 2020-03-03 DIAGNOSIS — Z3A39 39 weeks gestation of pregnancy: Secondary | ICD-10-CM

## 2020-03-03 DIAGNOSIS — O24425 Gestational diabetes mellitus in childbirth, controlled by oral hypoglycemic drugs: Principal | ICD-10-CM

## 2020-03-03 LAB — CBC
HCT: 35.1 % — ABNORMAL LOW (ref 36.0–46.0)
Hemoglobin: 11.3 g/dL — ABNORMAL LOW (ref 12.0–15.0)
MCH: 28 pg (ref 26.0–34.0)
MCHC: 32.2 g/dL (ref 30.0–36.0)
MCV: 87.1 fL (ref 80.0–100.0)
Platelets: 185 10*3/uL (ref 150–400)
RBC: 4.03 MIL/uL (ref 3.87–5.11)
RDW: 14.9 % (ref 11.5–15.5)
WBC: 18.5 10*3/uL — ABNORMAL HIGH (ref 4.0–10.5)
nRBC: 0 % (ref 0.0–0.2)

## 2020-03-03 LAB — GLUCOSE, CAPILLARY
Glucose-Capillary: 105 mg/dL — ABNORMAL HIGH (ref 70–99)
Glucose-Capillary: 93 mg/dL (ref 70–99)

## 2020-03-03 LAB — ABO/RH: ABO/RH(D): O POS

## 2020-03-03 MED ORDER — PRENATAL MULTIVITAMIN CH
1.0000 | ORAL_TABLET | Freq: Every day | ORAL | Status: DC
  Administered 2020-03-04: 12:00:00 1 via ORAL
  Filled 2020-03-03: qty 1

## 2020-03-03 MED ORDER — SODIUM CHLORIDE (PF) 0.9 % IJ SOLN
INTRAMUSCULAR | Status: DC | PRN
  Administered 2020-03-03: 12 mL/h via EPIDURAL

## 2020-03-03 MED ORDER — ACETAMINOPHEN 325 MG PO TABS
650.0000 mg | ORAL_TABLET | Freq: Four times a day (QID) | ORAL | Status: DC | PRN
  Administered 2020-03-03 – 2020-03-04 (×5): 650 mg via ORAL
  Filled 2020-03-03 (×5): qty 2

## 2020-03-03 MED ORDER — ONDANSETRON HCL 4 MG/2ML IJ SOLN: 4.0000 mg | INTRAMUSCULAR | Status: DC | PRN

## 2020-03-03 MED ORDER — ONDANSETRON HCL 4 MG PO TABS: 4.0000 mg | ORAL_TABLET | ORAL | Status: DC | PRN

## 2020-03-03 MED ORDER — SENNOSIDES-DOCUSATE SODIUM 8.6-50 MG PO TABS
2.0000 | ORAL_TABLET | ORAL | Status: DC
  Administered 2020-03-04: 2 via ORAL
  Filled 2020-03-03: qty 2

## 2020-03-03 MED ORDER — DIBUCAINE (PERIANAL) 1 % EX OINT: 1.0000 "application " | TOPICAL_OINTMENT | CUTANEOUS | Status: DC | PRN

## 2020-03-03 MED ORDER — IBUPROFEN 600 MG PO TABS
600.0000 mg | ORAL_TABLET | Freq: Three times a day (TID) | ORAL | Status: DC | PRN
  Administered 2020-03-03 – 2020-03-04 (×3): 600 mg via ORAL
  Filled 2020-03-03 (×3): qty 1

## 2020-03-03 MED ORDER — MEASLES, MUMPS & RUBELLA VAC IJ SOLR: 0.5000 mL | Freq: Once | INTRAMUSCULAR | Status: DC

## 2020-03-03 MED ORDER — COCONUT OIL OIL: 1.0000 "application " | TOPICAL_OIL | Status: DC | PRN

## 2020-03-03 MED ORDER — DIPHENHYDRAMINE HCL 25 MG PO CAPS: 25.0000 mg | ORAL_CAPSULE | Freq: Four times a day (QID) | ORAL | Status: DC | PRN

## 2020-03-03 MED ORDER — OXYCODONE HCL 5 MG PO TABS
5.0000 mg | ORAL_TABLET | Freq: Four times a day (QID) | ORAL | Status: DC | PRN
  Administered 2020-03-03 – 2020-03-04 (×4): 5 mg via ORAL
  Filled 2020-03-03 (×4): qty 1

## 2020-03-03 MED ORDER — BENZOCAINE-MENTHOL 20-0.5 % EX AERO: 1.0000 "application " | INHALATION_SPRAY | CUTANEOUS | Status: DC | PRN

## 2020-03-03 MED ORDER — SIMETHICONE 80 MG PO CHEW: 80.0000 mg | CHEWABLE_TABLET | ORAL | Status: DC | PRN

## 2020-03-03 MED ORDER — TETANUS-DIPHTH-ACELL PERTUSSIS 5-2.5-18.5 LF-MCG/0.5 IM SUSP: 0.5000 mL | Freq: Once | INTRAMUSCULAR | Status: DC

## 2020-03-03 MED ORDER — LIDOCAINE-EPINEPHRINE (PF) 2 %-1:200000 IJ SOLN
INTRAMUSCULAR | Status: DC | PRN
  Administered 2020-03-03: 3 mL via EPIDURAL
  Administered 2020-03-03: 2 mL via EPIDURAL

## 2020-03-03 MED ORDER — WITCH HAZEL-GLYCERIN EX PADS: 1.0000 "application " | MEDICATED_PAD | CUTANEOUS | Status: DC | PRN

## 2020-03-03 NOTE — Anesthesia Postprocedure Evaluation (Signed)
Anesthesia Post Note  Patient: Christine Kaiser  Procedure(s) Performed: AN AD HOC LABOR EPIDURAL     Patient location during evaluation: Mother Baby Anesthesia Type: Epidural Level of consciousness: awake and alert Pain management: pain level controlled Vital Signs Assessment: post-procedure vital signs reviewed and stable Respiratory status: spontaneous breathing, nonlabored ventilation and respiratory function stable Cardiovascular status: stable Postop Assessment: no headache, no backache and epidural receding Anesthetic complications: no    Last Vitals:  Vitals:   03/03/20 1258 03/03/20 1630  BP: 133/72 (!) 124/52  Pulse: 79 76  Resp: 18 20  Temp: 37.2 C 36.7 C  SpO2: 99% 97%    Last Pain:  Vitals:   03/03/20 1810  TempSrc:   PainSc: 5    Pain Goal:                   Kymberly Blomberg

## 2020-03-03 NOTE — Progress Notes (Signed)
Labor Progress Note Christine Kaiser is a 29 y.o. G2P1001 at [redacted]w[redacted]d presented for IOL for GDMA2. S: Very uncomfortable with ctx and feeling pressure.   O:  BP 121/64   Pulse 90   Temp 98.4 F (36.9 C) (Oral)   Resp 16   Ht 4\' 11"  (1.499 m)   Wt 97.5 kg   LMP 06/07/2019   SpO2 99%   BMI 43.42 kg/m  EFM: 135, moderate variability, pos accels, early decels and some variables, reactive TOCO: q29m  CVE: Dilation: 8 Effacement (%): 90 Cervical Position: Middle Station: 0 Presentation: Vertex Exam by:: [email or web address]   A&P: 29 y.o. G2P1001 [redacted]w[redacted]d here for IOL for GDMA2. #Labor: S/p Cytotec and FB. S/p AROM. Cont Pitocin. Making slow change. Cont position changes. Anticipate SVD. #Pain: epidural #FWB: Cat II; reassuring for moderate variability  #GBS positive; PCN #GDMA2: last glucose 105  [redacted]w[redacted]d, MD 8:06 AM

## 2020-03-03 NOTE — Anesthesia Preprocedure Evaluation (Signed)
Anesthesia Evaluation  Patient identified by MRN, date of birth, ID band Patient awake    Reviewed: Allergy & Precautions, Patient's Chart, lab work & pertinent test results  Airway Mallampati: II  TM Distance: >3 FB Neck ROM: Full    Dental no notable dental hx. (+) Poor Dentition, Dental Advisory Given   Pulmonary Current Smoker,    Pulmonary exam normal breath sounds clear to auscultation       Cardiovascular negative cardio ROS Normal cardiovascular exam Rhythm:Regular Rate:Normal     Neuro/Psych PSYCHIATRIC DISORDERS Anxiety Depression negative neurological ROS     GI/Hepatic Neg liver ROS, GERD  Medicated and Controlled,  Endo/Other  diabetes, Gestational, Oral Hypoglycemic AgentsMorbid obesityBMI 43  Renal/GU negative Renal ROS  negative genitourinary   Musculoskeletal negative musculoskeletal ROS (+)   Abdominal   Peds  Hematology  (+) Blood dyscrasia, anemia , hct 35.1, plt 185   Anesthesia Other Findings   Reproductive/Obstetrics (+) Pregnancy                           Anesthesia Physical Anesthesia Plan  ASA: III  Anesthesia Plan: Epidural   Post-op Pain Management:    Induction:   PONV Risk Score and Plan: Treatment may vary due to age or medical condition  Airway Management Planned: Natural Airway  Additional Equipment:   Intra-op Plan:   Post-operative Plan:   Informed Consent: I have reviewed the patients History and Physical, chart, labs and discussed the procedure including the risks, benefits and alternatives for the proposed anesthesia with the patient or authorized representative who has indicated his/her understanding and acceptance.       Plan Discussed with: Anesthesiologist  Anesthesia Plan Comments: (Patient identified. Risks, benefits, options discussed with patient including but not limited to bleeding, infection, nerve damage, paralysis, failed  block, incomplete pain control, headache, blood pressure changes, nausea, vomiting, reactions to medication, itching, and post partum back pain. Confirmed with bedside nurse the patient's most recent platelet count. Confirmed with the patient that they are not taking any anticoagulation, have any bleeding history or any family history of bleeding disorders. Patient expressed understanding and wishes to proceed. All questions were answered. )        Anesthesia Quick Evaluation

## 2020-03-03 NOTE — Anesthesia Procedure Notes (Signed)
Epidural Patient location during procedure: OB Start time: 03/03/2020 6:30 AM End time: 03/03/2020 6:45 AM  Staffing Anesthesiologist: Elmer Picker, MD Performed: anesthesiologist   Preanesthetic Checklist Completed: patient identified, IV checked, risks and benefits discussed, monitors and equipment checked, pre-op evaluation and timeout performed  Epidural Patient position: sitting Prep: DuraPrep and site prepped and draped Patient monitoring: continuous pulse ox, blood pressure, heart rate and cardiac monitor Approach: midline Location: L3-L4 Injection technique: LOR air  Needle:  Needle type: Tuohy  Needle gauge: 17 G Needle length: 9 cm Needle insertion depth: 6.5 cm Catheter type: closed end flexible Catheter size: 19 Gauge Catheter at skin depth: 12 cm Test dose: negative  Assessment Sensory level: T8 Events: blood not aspirated, injection not painful, no injection resistance, no paresthesia and negative IV test  Additional Notes Patient identified. Risks/Benefits/Options discussed with patient including but not limited to bleeding, infection, nerve damage, paralysis, failed block, incomplete pain control, headache, blood pressure changes, nausea, vomiting, reactions to medication both or allergic, itching and postpartum back pain. Confirmed with bedside nurse the patient's most recent platelet count. Confirmed with patient that they are not currently taking any anticoagulation, have any bleeding history or any family history of bleeding disorders. Patient expressed understanding and wished to proceed. All questions were answered. Sterile technique was used throughout the entire procedure. Please see nursing notes for vital signs. Test dose was given through epidural catheter and negative prior to continuing to dose epidural or start infusion. Warning signs of high block given to the patient including shortness of breath, tingling/numbness in hands, complete motor block,  or any concerning symptoms with instructions to call for help. Patient was given instructions on fall risk and not to get out of bed. All questions and concerns addressed with instructions to call with any issues or inadequate analgesia.  Reason for block:procedure for pain

## 2020-03-03 NOTE — Discharge Summary (Signed)
Postpartum Discharge Summary   Patient Name: Christine Kaiser DOB: [birth year] MRN: [id number]  Date of admission: 03/02/2020 Delivering Provider: Chauncey Mann   Date of discharge: 03/04/2020  Admitting diagnosis: Gestational diabetes mellitus (GDM) affecting pregnancy, antepartum [O24.419] Intrauterine pregnancy: [redacted]w[redacted]d    Secondary diagnosis:  Active Problems:   Tobacco smoking affecting pregnancy, antepartum   Rubella non-immune status, antepartum   Gestational diabetes mellitus (GDM) affecting pregnancy, antepartum   Positive GBS test   SVD (spontaneous vaginal delivery)  Additional problems: None     Discharge diagnosis: Term Pregnancy Delivered and GDM A2                                                                                                Post partum procedures:MMR injection  Augmentation: AROM, Pitocin, Cytotec and Foley Balloon  Complications: None  Hospital course:  Induction of Labor With Vaginal Delivery   29y.o. yo G2P1001 at 344w1das admitted to the hospital 03/02/2020 for induction of labor.  Indication for induction: A2 DM.  Patient had an uncomplicated labor course as follows: Initial SVE: 1/50/-2. Received Cytotec, Foley balloon, Pitocin and AROM.  She then progressed to complete.  Membrane Rupture Time/Date: 5:04 AM ,03/03/2020   Intrapartum Procedures: Episiotomy: None [1]                                         Lacerations:  None [1]  Patient had delivery of a Viable infant.  Information for the patient's newborn:  HuJourney, Castonguayirl JuPattijo0[[id number]]Delivery Method: Vaginal, Spontaneous(Filed from Delivery Summary)    03/03/2020  Details of delivery can be found in separate delivery note.  Patient had a routine postpartum course. Fasting AM glucose 90.  Patient is discharged home 03/04/20. Delivery time: 9:45 AM    Magnesium Sulfate received: No BMZ received: No Rhophylac:No MMR:Yes Transfusion:No  Physical exam  Vitals:   03/03/20 1630  03/03/20 2053 03/04/20 0037 03/04/20 0536  BP: (!) 124/52 (!) 104/59 (!) 103/51 (!) 108/59  Pulse: 76 94 79 73  Resp: '20 18 16 16  '$ Temp: 98.1 F (36.7 C) 98.3 F (36.8 C) 97.8 F (36.6 C) 97.6 F (36.4 C)  TempSrc: Oral Oral Oral Oral  SpO2: 97% 96% 98% 97%  Weight:      Height:       General: alert, cooperative and no distress Lochia: appropriate Uterine Fundus: firm Incision: N/A DVT Evaluation: No evidence of DVT seen on physical exam. No significant calf/ankle edema. Labs: Lab Results  Component Value Date   WBC 18.5 (H) 03/03/2020   HGB 11.3 (L) 03/03/2020   HCT 35.1 (L) 03/03/2020   MCV 87.1 03/03/2020   PLT 185 03/03/2020   No flowsheet data found. Edinburgh Score: Edinburgh Postnatal Depression Scale Screening Tool 03/03/2020  I have been able to laugh and see the funny side of things. 0  I have looked forward with enjoyment to things. 1  I have blamed myself unnecessarily when things went  wrong. 0  I have been anxious or worried for no good reason. 2  I have felt scared or panicky for no good reason. 2  Things have been getting on top of me. 1  I have been so unhappy that I have had difficulty sleeping. 2  I have felt sad or miserable. 1  I have been so unhappy that I have been crying. 0  The thought of harming myself has occurred to me. 0  Edinburgh Postnatal Depression Scale Total 9    Discharge instruction: per After Visit Summary and "Baby and Me Booklet".  After visit meds:  Allergies as of 03/04/2020      Reactions   Coconut Oil       Medication List    STOP taking these medications   Accu-Chek Guide test strip Generic drug: glucose blood   Accu-Chek Softclix Lancets lancets   metFORMIN 500 MG tablet Commonly known as: GLUCOPHAGE     TAKE these medications   [place] 1 Device by Does not apply route daily.   famotidine 20 MG tablet Commonly known as: Pepcid Take 1 tablet (20 mg total) by mouth 2 (two) times  daily.   hydrOXYzine 25 MG capsule Commonly known as: Vistaril Take 1-2 capsules (25-50 mg total) by mouth 3 (three) times daily as needed for anxiety.   ibuprofen 600 MG tablet Commonly known as: ADVIL Take 1 tablet (600 mg total) by mouth every 8 (eight) hours as needed for mild pain.   oxyCODONE 5 MG immediate release tablet Commonly known as: Oxy IR/ROXICODONE Take 1 tablet (5 mg total) by mouth every 6 (six) hours as needed for moderate pain.   Vitafol Gummies 3.33-0.333-34.8 MG Chew Chew 1 tablet by mouth daily.       Diet: routine diet  Activity: Advance as tolerated. Pelvic rest for 6 weeks.   Outpatient follow up:4 weeks Follow up Appt:No future appointments. Follow up Visit: [place]. Schedule an appointment as soon as possible for a visit in 4 week(s).   Specialty: Obstetrics and Gynecology Contact information: [address], [place] [phone]          Please schedule this patient for Postpartum visit in: 4 weeks with the following provider: MD to schedule BTL In person  For C/S patients schedule nurse incision check in weeks 2 weeks: no High risk pregnancy complicated by: GDM Delivery mode:  SVD Anticipated Birth Control:  Plans Interval BTL (paperwork already signed) PP Procedures needed: 2 hour GTT  Schedule Integrated BH visit: no   Newborn Data: Live born female  Birth Weight: 3232g  APGAR: 8, 9  Newborn Delivery   Birth date/time: 03/03/2020 09:45:00 Delivery type: Vaginal, Spontaneous      Baby Feeding: Bottle and Breast Disposition:home with mother   03/04/2020 Lajean Manes, CNM

## 2020-03-04 LAB — GLUCOSE, CAPILLARY: Glucose-Capillary: 90 mg/dL (ref 70–99)

## 2020-03-04 MED ORDER — CYCLOBENZAPRINE HCL 10 MG PO TABS: 10.0000 mg | ORAL_TABLET | Freq: Two times a day (BID) | ORAL | 0 refills | Status: DC | PRN

## 2020-03-04 MED ORDER — OXYCODONE HCL 5 MG PO TABS: 5.0000 mg | ORAL_TABLET | Freq: Four times a day (QID) | ORAL | 0 refills | Status: DC | PRN

## 2020-03-04 MED ORDER — CYCLOBENZAPRINE HCL 10 MG PO TABS
10.0000 mg | ORAL_TABLET | Freq: Two times a day (BID) | ORAL | Status: DC | PRN
  Administered 2020-03-04: 10 mg via ORAL
  Filled 2020-03-04 (×2): qty 1

## 2020-03-04 MED ORDER — IBUPROFEN 600 MG PO TABS: 600.0000 mg | ORAL_TABLET | Freq: Three times a day (TID) | ORAL | 0 refills | Status: DC | PRN

## 2020-03-04 NOTE — Lactation Note (Addendum)
This note was copied from a baby's chart. Lactation Consultation Note  Patient Name: Christine Kaiser LFYBO'F Date: 03/04/2020 Reason for consult: Initial assessment;1st time breastfeeding;Term  1539 - 1600 - Ms. Tribby changed feeding plan to breast feeding after baby "Christine Kaiser" would not take a bottle; mom states that she is latching to the breast well. Her first child did not latch, and she formula fed.  Ms. Presti has everted nipples that are WNL, and when she hand expresses, colostrum is noted immediately (ample amounts). She has [facility], and no breast pump. She is interested in obtaining one. I agreed to put in a referral to [facility], and I encouraged her to call them early next week to follow up on her eligibility.  I brought in a manual pump with size 27 flange and demonstrated how to use it. I conducted breast feeding basic education. Christine Kaiser was asleep in her bassinet, and Ms. Lotspeich states that she breast feed shortly before I entered for 30 minutes. I did not have the opportunity to observe her feed.   I encouraged her to breast feed 8-12 times a day on demand. Day 2 infant feeding patterns (nighttime cluster feeding) reviewed. I also reviewed milk storage guidelines and other information from her [place] booklet. I encouraged her to post-pump as needed to relieve engorgement or for supplementation if indicated by her pediatrician.   Ms. Skog hopes to use [facility] for follow up. She does not have an initial appointment set up. I encouraged her to set one up within a few days of discharge.   All questions answered at this time. I praised Ms. Newsome for choosing to breast feed and reviewed the benefits of breast milk.  Breast feeding community resources reviewed and added to her folder.  Maternal Data Has patient been taught Hand Expression?: Yes Does the patient have breastfeeding experience prior to this delivery?: No(attempted; no latch; formula  fed)   Interventions Interventions: Breast feeding basics reviewed;Hand express  Lactation Tools Discussed/Used WIC Program: Yes Pump Review: Setup, frequency, and cleaning;Milk Storage Initiated by:: hl Date initiated:: 03/04/20   Consult Status Consult Status: Complete    Walker Shadow 03/04/2020, 4:02 PM

## 2020-03-04 NOTE — Clinical Social Work Maternal (Addendum)
CLINICAL SOCIAL WORK MATERNAL/CHILD NOTE  Patient Details  Name: Christine Kaiser MRN: [id number] Date of Birth: 11/27/1991  Date:  03/04/2020  Clinical Social Worker Initiating Note:  Bentlie Catanzaro, MSW, LCSWA Date/Time: Initiated:  03/04/20/1130     Child's Name:  Christine Kaiser   Biological Parents:  Mother(FOB, Christine Kaiser, 30)   Need for Interpreter:      Reason for Referral:  Current Substance Use/Substance Use During Pregnancy , Behavioral Health Concerns, Other (Comment)(DOes not have custody of 1st child)   Address:  [address] [place] [address]    Phone number:  [phone] (home)     Additional phone number: [phone]  Household Members/Support Persons (HM/SP):   Household Member/Support Person 1, Household Member/Support Person 2, Household Member/Support Person 3, Household Member/Support Person 4   HM/SP Name Relationship DOB or Age  HM/SP -1 Christine Kaiser mom 49  HM/SP -2 Christine Kaiser sister 27  HM/SP -3 Christine Kaiser nephew 7  HM/SP -4 Christine Kaiser niece 3  HM/SP -5        HM/SP -6        HM/SP -7        HM/SP -8          Natural Supports (not living in the home):  Parent, Immediate Family   Professional Supports:     Employment: Disabled   Type of Work:     Education:  Some College   Homebound arranged:    Financial Resources:  Medicare    Other Resources:  Food Stamps , WIC   Cultural/Religious Considerations Which May Impact Care:  none  Strengths:  Home prepared for child    Psychotropic Medications:         Pediatrician:       Pediatrician List:   [place]    [place]    [place]    [place]    [place]    [place]      Pediatrician Fax Number:    Risk Factors/Current Problems:  Mental Health Concerns , Substance Use    Cognitive State:  Alert    Mood/Affect:  Relaxed , Calm    CSW Assessment: CSW met with MOB at bedside to discuss substance use  during pregnancy and behavioral health concerns. CSW introduced self and explained reason consult. MOB was welcoming, pleasant, open and engaged during assessment. MOB reported that she resides with her mom, sister, and niece and nephew. MOB reported that she is on disability and receives both WIC and food stamps. MOB reported that she has all items needed to care for infant including car seat and bassinet. CSW inquired about MOB's support system, MOB reported that her mom and sister are her supports. MOB reported that she is unsure if FOB will be involved.    CSW inquired about MOB's mental health history, MOB reported that she was diagnosed with Anxiety and Depression over ten years ago. MOB reported that she was on medication but discontinued use about 4 years ago because she did not need it anymore. MOB reported that she is not in counseling at this time but recently attended a couple of grief support group groups after the loss of a loved one in November. MOB reported she is interested in receiving counseling. CSW inquired about how MOB was feeling emotionally after giving birth, MOB reported that she felt happy and tired. CSW acknowledged and validated MOB's feelings. CSW assessed for safety, MOB denied SI, HI and current domestic   violence. MOB presented calm and did not demonstrate any acute mental health signs/symptoms.    CSW provided education regarding the baby blues period vs. perinatal mood disorders, discussed treatment and gave resources for mental health follow up if concerns arise.  CSW recommends self-evaluation during the postpartum time period using the New Mom Checklist from Postpartum Progress and encouraged MOB to contact a medical professional if symptoms are noted at any time.  MOB asked appropriate questions and stated understanding   CSW provided review of Sudden Infant Death Syndrome (SIDS) precautions.  MOB reports having all needed items for baby including new car seat and  bassinet for baby's safe sleeping area.    CSW informed MOB about the hospital drug screen policy due to substance use during pregnancy. MOB confirmed that she used marijuana during her pregnancy to reduce nausea and increase appetite. CSW thanked MOB for being honest. MOB denied any other substance use during pregnancy. CSW informed MOB that infant's UDS and CDS would be monitored and a CPS report would be made if warranted. MOB verbalized understanding and denied any questions/concerns. MOB denied any CPS history. MOB reports oldest child Christine Kaiser is in the custody of his grandmother, Christine Kaiser in [place]. MOB reports she signed over custody to Ms. Kaiser when Christine was an infant. MOB reports she was overwhelmed with caring for Christine sick Kaiser and could not care for both, infant and boyfriend. Christine Kaiser died this past November of cancer.  CSW offered condolences, resources, and support.    CSW is contacted CPS to assess 1st child's custody history and baby's safe discharge. CSW spoke with Investigator [name]. CPS report was screened in for CPS follow-up in the home, and referral to [facility]. [name] stated "there are no barriers to discharging infant with MOB.    CSW identifies no further need for intervention and no barriers to discharge at this time.  CSW Plan/Description:  Sudden Infant Death Syndrome (SIDS) Education, Perinatal Mood and Anxiety Disorder (PMADs) Education, Hospital Drug Screen Policy Information, CSW Will Continue to Monitor Umbilical Cord Tissue Drug Screen Results and Make Report if Warranted, Child Protective Service Report    Jennings Corado D. Shianne Zeiser, MSW, LCSWA Clinical Social Worker [phone] 03/04/2020, 12:38 PM 

## 2020-03-04 NOTE — Progress Notes (Signed)
AVS printed and reviewed with patient. Patient informed to call for follow up visit. All questions answered and pt verbalized understanding.

## 2020-03-30 ENCOUNTER — Ambulatory Visit: Payer: Medicare Other | Admitting: Obstetrics and Gynecology

## 2020-04-13 ENCOUNTER — Ambulatory Visit: Payer: Medicare Other | Admitting: Obstetrics and Gynecology

## 2020-06-13 ENCOUNTER — Other Ambulatory Visit: Payer: Self-pay | Admitting: Obstetrics

## 2020-06-13 DIAGNOSIS — K219 Gastro-esophageal reflux disease without esophagitis: Secondary | ICD-10-CM

## 2021-04-28 ENCOUNTER — Other Ambulatory Visit: Payer: Self-pay

## 2021-04-28 ENCOUNTER — Ambulatory Visit (HOSPITAL_COMMUNITY)
Admission: EM | Admit: 2021-04-28 | Discharge: 2021-04-28 | Disposition: A | Discharge status: Home / Self Care | Payer: Medicare Other | Attending: Family | Admitting: Family

## 2021-04-28 DIAGNOSIS — F411 Generalized anxiety disorder: Secondary | ICD-10-CM

## 2021-04-28 DIAGNOSIS — F418 Other specified anxiety disorders: Secondary | ICD-10-CM

## 2021-04-28 MED ORDER — HYDROXYZINE PAMOATE 25 MG PO CAPS: 25.0000 mg | ORAL_CAPSULE | Freq: Three times a day (TID) | ORAL | 0 refills | Status: DC | PRN

## 2021-04-28 MED ORDER — HYDROXYZINE HCL 25 MG PO TABS: 25.0000 mg | ORAL_TABLET | Freq: Four times a day (QID) | ORAL | Status: DC | PRN

## 2021-04-28 NOTE — Progress Notes (Signed)
Patient presents at the [facility] seeking help for her anxiety.  Patient states that she has an appointment to see someone on Monday, but states that her anxiety has gotten so bad that she does not feel like she can make it until Monday.  Patient states that she moved her a year ago and states that she has not had a provider.  Patient states that she was on medications and seeing a therapist in [place].  Patient states that the medication never worked.  Patient states that she has stressors with her children and patient states that because of her stress that she has been smoking excessively, she has been picking at her skin and she states that she has been biting the inside of her mouth.  Patient denies SI/HI/Psychosis.  She states that she smokes marijuana on occasion in order to help with ehr stress level, but states that it is a hit or miss as to whether it works or not. Patient is requesting medication for her anxiety today.  Patient is routine.

## 2021-04-28 NOTE — Discharge Instructions (Addendum)
Take all medications as prescribed. Keep all follow-up appointments as scheduled.  Do not consume alcohol or use illegal drugs while on prescription medications. Report any adverse effects from your medications to your primary care provider promptly.  In the event of recurrent symptoms or worsening symptoms, call 911, a crisis hotline, or go to the nearest emergency department for evaluation.   

## 2021-04-28 NOTE — ED Provider Notes (Signed)
Behavioral Health Urgent Care Medical Screening Exam  Patient Name: Christine Kaiser MRN: [id number] Date of Evaluation: 04/28/21 Chief Complaint:   Diagnosis:  Final diagnoses:  Generalized anxiety disorder    History of Present illness: Christine Kaiser is a 30 y.o. female.  Presents to [facility] urgent care reporting worsening anxiety state.  States she recently moved to the area and was followed by therapy in [place].  She is denying suicidal or homicidal ideations.  Denies auditory visual hallucinations.  Reports she has been on multiple medication combinations for depression and anxiety.    Christine Kaiser reports " none of the medications have been helpful."  Discussed patient to follow-up with primary care provider and walk-in appointments for open access at [facility] urgent care.  We will make hydroxyzine 25 to 50 mg available p.o. 3 times daily x10 tablets until outpatient follow-up appointment on 5/31.  Support, encouragement and reassurance was provided.  Psychiatric Specialty Exam  Presentation  General Appearance:Appropriate for Environment  Eye Contact:Good  Speech:Clear and Coherent  Speech Volume:Normal  Handedness:Right   Mood and Affect  Mood:Anxious; Depressed  Affect:Congruent   Thought Process  Thought Processes:Coherent  Descriptions of Associations:Intact  Orientation:Full (Time, Place and Person)  Thought Content:Logical    Hallucinations:None  Ideas of Reference:None  Suicidal Thoughts:No  Homicidal Thoughts:No   Sensorium  Memory:Immediate Good; Recent Good; Remote Good  Judgment:Fair  Insight:Fair   Executive Functions  Concentration:Fair  Attention Span:Good  Recall:Good  Fund of Knowledge:Good  Language:Fair   Psychomotor Activity  Psychomotor Activity:Normal   Assets  Assets:Social Support   Sleep  Sleep:Fair  Number of hours: No data recorded  Nutritional Assessment (For OBS and FBC admissions  only) Has the patient had a weight loss or gain of 10 pounds or more in the last 3 months?: No Has the patient had a decrease in food intake/or appetite?: No Does the patient have dental problems?: No Does the patient have eating habits or behaviors that may be indicators of an eating disorder including binging or inducing vomiting?: No Has the patient recently lost weight without trying?: No Has the patient been eating poorly because of a decreased appetite?: No Malnutrition Screening Tool Score: 0    Physical Exam: Physical Exam Vitals reviewed.  Cardiovascular:     Rate and Rhythm: Normal rate and regular rhythm.  Neurological:     Mental Status: She is alert.  Psychiatric:        Attention and Perception: Attention and perception normal.        Mood and Affect: Mood normal.        Speech: Speech normal.        Behavior: Behavior normal.        Thought Content: Thought content includes suicidal ideation. Thought content includes suicidal plan.        Cognition and Memory: Cognition normal.        Judgment: Judgment normal.    Review of Systems  Psychiatric/Behavioral: Negative for depression and suicidal ideas. The patient is nervous/anxious.   All other systems reviewed and are negative.  Blood pressure 101/71, pulse 90, temperature (!) 89 F (31.7 C), temperature source Oral, resp. rate 18, height 4\' 10"  (1.473 m), weight 191 lb (86.6 kg), SpO2 98 %, unknown if currently breastfeeding. Body mass index is 39.92 kg/m.  Musculoskeletal: Strength & Muscle Tone: within normal limits Gait & Station: normal Patient leans: N/A   BHUC MSE Discharge Disposition for Follow up and Recommendations: Based on my  evaluation the patient does not appear to have an emergency medical condition and can be discharged with resources and follow up care in outpatient services for Medication Management, Partial Hospitalization Program and Individual Therapy   - Christine Kaiser was provided with a  prescription for hydroxyzine 25 to 50 mg x 10 tablets until follow-up appointment with primary care provider.  Patient was provided resources for Open access (Monday through Thursday from 8-11)  follow-up with therapy and psychiatry.   Christine Rack, NP 04/28/2021, 4:06 PM

## 2021-04-30 ENCOUNTER — Telehealth (HOSPITAL_COMMUNITY): Payer: Self-pay | Admitting: Family Medicine

## 2021-04-30 NOTE — BH Assessment (Signed)
Care Management - Follow Up [facility] Discharges   Writer attempted to make contact with patient today and was unsuccessful.  Writer was able to leave a HIPPA compliant voice message and will await callback.    History of Present illness: Christine Kaiser is a 30 y.o. female.  Presents to [facility] urgent care reporting worsening anxiety state.  States she recently moved to the area and was followed by therapy in [place].  She is denying suicidal or homicidal ideations.  Denies auditory visual hallucinations.  Reports she has been on multiple medication combinations for depression and anxiety.    [name] reports " none of the medications have been helpful."  Discussed patient to follow-up with primary care provider and walk-in appointments for open access at [facility] urgent care.  We will make hydroxyzine 25 to 50 mg available p.o. 3 times daily x10 tablets until outpatient follow-up appointment on 5/31.  Support, encouragement and reassurance was provided.

## 2021-04-30 NOTE — BH Assessment (Signed)
Care Management - Follow Up [facility] Discharges   Writer made contact with the patient.  Patient reports that she will follow up with a provider next Monday.

## 2021-05-07 ENCOUNTER — Encounter: Payer: Self-pay | Admitting: Family Medicine

## 2021-05-07 ENCOUNTER — Other Ambulatory Visit: Payer: Self-pay

## 2021-05-07 ENCOUNTER — Ambulatory Visit (INDEPENDENT_AMBULATORY_CARE_PROVIDER_SITE_OTHER): Payer: Medicare Other | Admitting: Family Medicine

## 2021-05-07 VITALS — BP 110/58 | HR 95 | Ht 59.0 in | Wt 186.2 lb

## 2021-05-07 DIAGNOSIS — Z0000 Encounter for general adult medical examination without abnormal findings: Secondary | ICD-10-CM | POA: Insufficient documentation

## 2021-05-07 DIAGNOSIS — E669 Obesity, unspecified: Secondary | ICD-10-CM | POA: Diagnosis not present

## 2021-05-07 DIAGNOSIS — F419 Anxiety disorder, unspecified: Secondary | ICD-10-CM | POA: Diagnosis not present

## 2021-05-07 DIAGNOSIS — Z131 Encounter for screening for diabetes mellitus: Secondary | ICD-10-CM | POA: Diagnosis not present

## 2021-05-07 DIAGNOSIS — Z800 Family history of malignant neoplasm of digestive organs: Secondary | ICD-10-CM | POA: Diagnosis not present

## 2021-05-07 DIAGNOSIS — Z6837 Body mass index (BMI) 37.0-37.9, adult: Secondary | ICD-10-CM | POA: Diagnosis not present

## 2021-05-07 LAB — POCT GLYCOSYLATED HEMOGLOBIN (HGB A1C): Hemoglobin A1C: 5.5 % (ref 4.0–5.6)

## 2021-05-07 MED ORDER — ESCITALOPRAM OXALATE 10 MG PO TABS: 10.0000 mg | ORAL_TABLET | Freq: Every day | ORAL | 1 refills | Status: DC

## 2021-05-07 NOTE — Assessment & Plan Note (Addendum)
Patient is morbidly obese with a BMI of 37.  We discussed exercise and diet recommendations.  We discussed cutting back on alcohol and tobacco.  She is not ready to cut back on cigarettes at this time, advised that she let me know so that we can work together in the future to cut down.  Given obesity, screened for diabetes with a hemoglobin A1c (which came back at 5.5) and check lipid panel.  Discussed with patient that even if her lipid screening is elevated, first-line treatment would be diet and exercise given her age.  She reports being up-to-date on her Pap smear, believes she had it last year and it was normal.  She uses condoms for protection, has tried oral contraceptive pills in the past and did not like this.  In the future, we can discuss more options for contraception. -F/u Lipids -Discuss contraceptive options in the future -Needs dental and vision checks

## 2021-05-07 NOTE — Progress Notes (Addendum)
SUBJECTIVE:   CHIEF COMPLAINT / HPI:   Anxiety  Depression Patient with concerns at her anxiety and depression is worsening.  Constantly biting the inside of her cheeks, fingernails and toes.  She was seen at behavioral health urgent care for this recently.  States that she was given a medication that did not help.  Upon chart review appears that she was prescribed hydroxyzine.  States that she took her last pill yesterday.  She recently moved to the area from [place].  Over there she was seeing a therapist and was seen at a behavioral health clinic.  She is unsure of what medications she is tried in the past.  States that she was told she had a history of bipolar one-point but then saw a different provider which stated that she did not have bipolar.  She reports that the medication they gave her for bipolar "made me bipolar".  She does not recall any specific medication that has helped with her anxiety thus far.  Currently, she is self-medicating with marijuana.  States that she smokes 1-2 blunts a day sometimes more.  She drinks alcohol "sometimes".  She had  Two 40 ounce beers yesterday but states she has only drank once in the last week. Has a family history of alcoholism and bipolar disorder (in her sister).  She denies suicidal or homicidal ideations.  Does note recent stressors but does not go into detail about them.  No auditory visual hallucinations.  Family history of colon cancer Patient is worried about colon cancer notes strong family history in her mother who was diagnosed about 5 years ago.  Also states that her mother's father was diagnosed at the age of 48.  She has 2 first cousins in their 26s that were diagnosed.  Also notes that her maternal aunt passed away in her 30s from this.  She believes that she was tested for colon cancer about 10 years ago but does not know how she was tested.  She is unsure if she has had a colonoscopy in the past, she thinks she may have had one 5  years ago that was normal.  She denies constipation or diarrhea though states she has looser stools.  No bloody stools.  No abdominal pain.  No unintentional weight loss.  Occasionally has night sweats.  Family History Diabetes Reports that she has a family history of diabetes, unsure if type I or type II.  She is concerned about this.  She had gestational diabetes in her last pregnancy.  She does endorse polyuria and polydipsia.  Social History: Has 2 kids but only has custody of her daughter who is 79 year old.  Reports marijuana use and smokes cigarettes.  Currently smoking 1 pack every 1 to 2 days.  Has smoked for the last 10 years.  Not ready to quit smoking.  Drinks occasionally though reports heavy alcohol use in the past.  For exercise, she walks about a mile daily.  Reports that her diet is fairly well though admits to having a sweet tooth.  PERTINENT  PMH / PSH:  Past Medical History:  Diagnosis Date  . Anxiety and depression   . Cystocele, unspecified (CODE)     OBJECTIVE:   BP (!) 110/58   Pulse 95   Ht 4\' 11"  (1.499 m)   Wt 186 lb 3.2 oz (84.5 kg)   LMP 05/07/2021   SpO2 99%   BMI 37.61 kg/m    General: NAD, obese, able to participate  in exam HEENT: Poor dentition, normocephalic Cardiac: RRR, no murmurs. Respiratory: CTAB, normal effort, No wheezes, rales or rhonchi Abdomen: Bowel sounds present, nontender, nondistended, no hepatosplenomegaly. Extremities: no edema or cyanosis. Skin: warm and dry, no rashes noted Neuro: alert, no obvious focal deficits Psych: Depressed affect  Depression screen [facility] 2/9 05/07/2021 01/21/2020 11/24/2019  Decreased Interest 2 0 0  Down, Depressed, Hopeless 1 0 0  PHQ - 2 Score 3 0 0  Altered sleeping 1 - 3  Tired, decreased energy 1 - 3  Change in appetite 0 - 0  Feeling bad or failure about yourself  2 - 0  Trouble concentrating 0 - 0  Moving slowly or fidgety/restless 0 - 0  Suicidal thoughts 0 - 0  PHQ-9 Score 7 - 6  Difficult  doing work/chores Not difficult at all - -     ASSESSMENT/PLAN:   Anxiety PHQ-9 today with a score of 7, [place] postnatal depression scale also positive.  GAD-7 also positive with a total score of 17 indicating severe anxiety.  We will request records from patient's previous behavioral clinic.  MDQ done today which was not positive for bipolar, though does not completely rule this out.  Will start on Lexapro 10 mg daily.  Patient does report history of insomnia, Lexapro has less effect on insomnia.  Can consider adding on buspirone as well.  -Start Lexapro 10 mg daily -Follow-up with me on 6/17 -Consider addition of buspirone in the future -Resources for therapy/counseling given to patient today   Family history of colon cancer Patient reports very strong family history of colon cancer.  It does appear rather unclear to me at this time as it would be very uncommon for her maternal grandfather to be diagnosed at such a young age (19).  Patient also does not recall how she was tested for colon cancer or whether or not she has had a colonoscopy in the past.  Requested that she ask her family more regarding what type of cancer they were diagnosed with and/or if they were told they had a genetic disorder.  -Consider gastroenterology referral at f/u pending additional family history  Healthcare maintenance Patient is morbidly obese with a BMI of 37.  We discussed exercise and diet recommendations.  We discussed cutting back on alcohol and tobacco.  She is not ready to cut back on cigarettes at this time, advised that she let me know so that we can work together in the future to cut down.  Given obesity, screened for diabetes with a hemoglobin A1c (which came back at 5.5) and check lipid panel.  Discussed with patient that even if her lipid screening is elevated, first-line treatment would be diet and exercise given her age.  She reports being up-to-date on her Pap smear, believes she had it last year  and it was normal.  She uses condoms for protection, has tried oral contraceptive pills in the past and did not like this.  In the future, we can discuss more options for contraception. -F/u Lipids -Discuss contraceptive options in the future -Needs dental and vision checks     Sabino Dick, DO [facility]

## 2021-05-07 NOTE — Assessment & Plan Note (Addendum)
PHQ-9 today with a score of 7, [place] postnatal depression scale also positive.  GAD-7 also positive with a total score of 17 indicating severe anxiety.  We will request records from patient's previous behavioral clinic.  MDQ done today which was not positive for bipolar, though does not completely rule this out.  Will start on Lexapro 10 mg daily.  Patient does report history of insomnia, Lexapro has less effect on insomnia.  Can consider adding on buspirone as well.  -Start Lexapro 10 mg daily -Follow-up with me on 6/17 -Consider addition of buspirone in the future -Resources for therapy/counseling given to patient today

## 2021-05-07 NOTE — Patient Instructions (Addendum)
It was wonderful to meet you today!  Please bring ALL of your medications with you to every visit.   Today we talked about:  Your anxiety. I have started you on a medication called Lexapro. Take this once daily. Follow up with me next Friday at 11:15AM so we can discuss further.  Please try to obtain more information regarding your family history of colon cancer. Typically doctors will state that family members had a specific type or a certain gene that predisposed them to it.   We also did testing today to screen for diabetes and to check your cholesterol.    Today at your annual preventive visit we talked about the following measures:  I recommend 150 minutes of exercise per week-try 30 minutes 5 days per week We discussed reducing sugary beverages (like soda and juice) and increasing leafy greens and whole fruits.  We discussed avoiding tobacco and alcohol.  I recommend avoiding illicit substances.  Your blood pressure is 110/58 at goal of <120/80.  Thank you for choosing [facility].   Please call [phone] with any questions about today's appointment.  Please be sure to schedule follow up at the front  desk before you leave today.   Sabino Dick, DO PGY-1 Family Medicine     Therapy and Counseling Resources Most providers on this list will take Medicaid. Patients with commercial insurance or Medicare should contact their insurance company to get a list of in network providers.  [facility]:Psychiatry and Counseling [address] [facility] [address] [place], [place] [address] [phone]  [facility]  [address] [place], [place] [address]      [phone]  [facility] [address]  [place], [place] [address] [phone]  [facility] [address], [address]  [place], [place] [address]       [phone]     [facility] (virtual only) [phone]  Jovita Kussmaul [facility] [address] [place], [place] [phone]  [facility]:  [address] [place] [phone]  [facility]:  [address] [place] [phone]  [facility] (under & uninsured) [address]   [place] [phone]    [email or web address]    [facility] [address] . [place]    [phone]  [facility] -[address]     Phone:  [phone]     [facility]  [address]  [phone]   [facility] [address]      [place], [place] [phone]  [facility]: [address] [place], [place], [place]  [phone]   [facility] (Spanish therapist) [email or web address]  [address] [place]  [address]   [place] [address]    [phone]    [facility]   [address],  [place], [place]  [phone]   [facility]  [address] [place]  [phone]  [facility]  [address] [place], [place]        [phone]  Open Access/Walk In Clinic under & uninsured  [facility]  [address] [place], [place] [phone] Crisis [phone]  [facility] [address],  (Spanish)   [address], [place]: [phone]) 8:30 - 12; 1 - 2:30  [facility] [facility],  [address] [place]    [phone]):8:30 - 12; 2 - 3PM  [facility] [employer],  [address],  [place]; [phone]):   Mon - Fri 8 AM - 5 PM  [facility] [address] [place]  MWF 12:30 to 3:00 or call to schedule an appointment  [phone]  Specific Provider options Psychology Today  [email or web address]/us 1. click on find a therapist  2. enter your zip code 3. left side and select or tailor a therapist for your specific need.    [facility] Provider Directory [email or web address]  (Medicaid)   Follow all drop down to find a provider  Social Support program [facility] [place] [phone] or [employer] [address], [place], [place] Recovery support and educational   24- Hour Availability:  .  [facility]  . [address] [place], [place] [employer] [phone] Crisis [phone]  . [facility] [employer] [phone]  [facility] Crisis Service  [phone]   . [facility] [employer]  [phone] (after hours)  . Therapeutic Alternative/Mobile Crisis   [phone]  . [place] National Suicide Hotline  [phone] (TALK)  . Call 911 or go to emergency room  . [employer]  [phone]);  [place] and [employer]   . [facility]  [phone]); [place], [place], [place], [place], [place], [place], [place]

## 2021-05-07 NOTE — Assessment & Plan Note (Signed)
Patient reports very strong family history of colon cancer.  It does appear rather unclear to me at this time as it would be very uncommon for her maternal grandfather to be diagnosed at such a young age (87).  Patient also does not recall how she was tested for colon cancer or whether or not she has had a colonoscopy in the past.  Requested that she ask her family more regarding what type of cancer they were diagnosed with and/or if they were told they had a genetic disorder.  -Consider gastroenterology referral at f/u pending additional family history

## 2021-05-08 LAB — LIPID PANEL
Chol/HDL Ratio: 5.9 ratio — ABNORMAL HIGH (ref 0.0–4.4)
Cholesterol, Total: 200 mg/dL — ABNORMAL HIGH (ref 100–199)
HDL: 34 mg/dL — ABNORMAL LOW (ref >39)
LDL Chol Calc (NIH): 132 mg/dL — ABNORMAL HIGH (ref 0–99)
Triglycerides: 189 mg/dL — ABNORMAL HIGH (ref 0–149)
VLDL Cholesterol Cal: 34 mg/dL (ref 5–40)

## 2021-05-16 NOTE — Progress Notes (Signed)
    SUBJECTIVE:   CHIEF COMPLAINT / HPI:   Anxiety Follow Up Patient presents for follow up on anxiety. Was recently seen and started on Lexapro 10 mg. Only took for 3 days. States that she felt the medication was making her anxiety worse. She has not followed up on therapy/counseling yet but would like resources again. She can't recall the medication she was on in the past. She called a family member in the room and family member also could not recall which medication she was on but stated she was on Trazodone for sleep.   Family Hx of Colon Cancer Patient states that she was able to find out the gene mutation. States that her family has FAP.   PERTINENT  PMH / PSH:  Past Medical History:  Diagnosis Date   Anxiety and depression    Cystocele, unspecified (CODE)      OBJECTIVE:   BP 102/66   Pulse 76   Wt 183 lb (83 kg)   LMP 05/07/2021   SpO2 98%   BMI 36.96 kg/m    General: Anxious appearing, depressed affect Respiratory: Breathing comfortably on room air, in no respiratory distress Neuro: alert, no obvious focal deficits Psych: Anxious, depressed  Depression screen [facility] 2/9 05/18/2021 05/07/2021 01/21/2020  Decreased Interest 2 2 0  Down, Depressed, Hopeless 1 1 0  PHQ - 2 Score 3 3 0  Altered sleeping 1 1 -  Tired, decreased energy 1 1 -  Change in appetite 2 0 -  Feeling bad or failure about yourself  2 2 -  Trouble concentrating 2 0 -  Moving slowly or fidgety/restless 2 0 -  Suicidal thoughts 0 0 -  PHQ-9 Score 13 7 -  Difficult doing work/chores - Not difficult at all -   GAD 7 : Generalized Anxiety Score 05/18/2021 05/07/2021 11/24/2019  Nervous, Anxious, on Edge 3 3 1   Control/stop worrying 3 3 1   Worry too much - different things 3 3 1   Trouble relaxing 3 3 1   Restless 2 2 0  Easily annoyed or irritable 3 2 1   Afraid - awful might happen 2 1 0  Total GAD 7 Score 19 17 5   Anxiety Difficulty - Not difficult at all -     ASSESSMENT/PLAN:   Anxiety and  depression Unfortunately patient only took Lexapro for 3 days. Unable to call this a failed trial at this time given such short duration. We discussed that these medications take time before you can see effects. Also discussed that we have room to increase dose as needed. I gave her resources to therapy/counseling again. Patient amenable to trying Lexapro again- she had no other adverse effects besides intermittent diarrhea (1-2 episodes). Given that both her PHQ-9 and GAD-7 are also worsened, will add on BusPar as well for anxiety.  -Close follow up with me 6/29 -Referral to Psychiatry -Buspar 7.5 mg BID -Restart Lexapro 10 mg  -Resources to therapy/counseling provided.  Family history of FAP (familial adenomatous polyposis) Given her family history of FAP and with multiple family members with colon cancer, believe that patient would likely benefit from colon cancer screening.  -Referral to GI   Assistance needed with transportation Referral to CCM for assistance with transportation to appointments. Patient aware and thankful for this.      , DO [facility]

## 2021-05-18 ENCOUNTER — Encounter: Payer: Self-pay | Admitting: Family Medicine

## 2021-05-18 ENCOUNTER — Ambulatory Visit (INDEPENDENT_AMBULATORY_CARE_PROVIDER_SITE_OTHER): Payer: Medicare Other | Admitting: Family Medicine

## 2021-05-18 ENCOUNTER — Other Ambulatory Visit: Payer: Self-pay

## 2021-05-18 VITALS — BP 102/66 | HR 76 | Wt 183.0 lb

## 2021-05-18 DIAGNOSIS — F321 Major depressive disorder, single episode, moderate: Secondary | ICD-10-CM

## 2021-05-18 DIAGNOSIS — F32A Depression, unspecified: Secondary | ICD-10-CM

## 2021-05-18 DIAGNOSIS — Z659 Problem related to unspecified psychosocial circumstances: Secondary | ICD-10-CM | POA: Diagnosis not present

## 2021-05-18 DIAGNOSIS — F339 Major depressive disorder, recurrent, unspecified: Secondary | ICD-10-CM | POA: Diagnosis not present

## 2021-05-18 DIAGNOSIS — Z8371 Family history of adenomatous and serrated polyps: Secondary | ICD-10-CM | POA: Insufficient documentation

## 2021-05-18 DIAGNOSIS — F419 Anxiety disorder, unspecified: Secondary | ICD-10-CM | POA: Diagnosis present

## 2021-05-18 DIAGNOSIS — Z748 Other problems related to care provider dependency: Secondary | ICD-10-CM | POA: Diagnosis not present

## 2021-05-18 DIAGNOSIS — Z8372 Family history of familial adenomatous polyposis: Secondary | ICD-10-CM | POA: Insufficient documentation

## 2021-05-18 MED ORDER — BUSPIRONE HCL 7.5 MG PO TABS: 7.5000 mg | ORAL_TABLET | Freq: Two times a day (BID) | ORAL | 0 refills | Status: DC

## 2021-05-18 NOTE — Assessment & Plan Note (Signed)
Given her family history of FAP and with multiple family members with colon cancer, believe that patient would likely benefit from colon cancer screening.  -Referral to GI

## 2021-05-18 NOTE — Assessment & Plan Note (Signed)
Referral to CCM for assistance with transportation to appointments. Patient aware and thankful for this.

## 2021-05-18 NOTE — Patient Instructions (Addendum)
It was wonderful to see you today.  Please bring ALL of your medications with you to every visit.   Today we talked about:  Starting Lexapro again. Remember, this is a low-dose that we are starting at. It is important to take this every day. I am also adding on another medication to help your Anxiety. It is called Buspar.   I am sending a referral to a Gastroenterology given your family history of FAP.   Therapy and Counseling Resources Most providers on this list will take Medicaid. Patients with commercial insurance or Medicare should contact their insurance company to get a list of in network providers.  [facility]:Psychiatry and Counseling [address] [place], [place] [address] [phone]  [facility]  [address], Suite [place], [place] [address]      [phone]  [facility] [address]  [place], [place] [address] [phone]  [facility] [address]  [place], [place] [address]       [phone]     [facility] (virtual only) [phone]  Jovita Kussmaul [facility] [address] [place], [place] [phone]  [facility]:  [address] [place] [phone]  [facility]:  [address] [place] [phone]  [facility] (under & uninsured) [address]   [place] [phone]    [email or web address]    [facility] [address]  [place]    [phone]  [facility] -[address]     Phone:  [phone]     [facility]  [address]  [phone]   [facility] [address]      [place], [place] [phone]  [facility]: [address] [place], [place], [place]  [phone]   [facility] (Spanish therapist) [email or web address]  [address] [place]  [address]    [place] [address]    [phone]    [facility]   [address],  [place], [place]  [phone]   [facility]  [address] [place]  [phone]  [facility]  [address] [place], [place]        [phone]  Open Access/Walk In Clinic under & uninsured  [facility]  [address] [place], [place] [phone] Crisis [phone]  [facility] [place],  (Spanish)   [address], [place]: [phone]) 8:30 - 12; 1 - 2:30  [facility] [employer],  [address] [place], [place]    [phone]):8:30 - 12; 2 - 3PM  [facility] [employer],  [address]  [place]; [phone]):   Mon - Fri 8 AM - 5 PM  [facility] [address] [place]  MWF 12:30 to 3:00 or call to schedule an appointment  [phone]  Specific Provider options Psychology Today  [email or web address] click on find a therapist  enter your zip code left side and select or tailor a therapist for your specific need.   [facility] Provider Directory [email or web address]  (Medicaid)   Follow all drop down to find a provider  Social Support program [facility] [place] [phone] or Photo[employer].pl [address], [place], [place] Recovery support and educational   24- Hour Availability:   [facility]  [address] [place], [place] [phone] Crisis [phone]  [facility] [phone]  [facility]  [phone]   [facility]  [phone] (after hours)  Therapeutic Alternative/Mobile Crisis   [phone]  [place] National Suicide Hotline  [phone] Len Childs)  Call 911 or go to emergency room  [facility]  [phone]);  [place] and [employer]   [phone]); [place], [place], [place], [place], [place], [place], [place]    Thank you for choosing [facility].   Please call [phone] with any questions about today's appointment.  Please be sure to schedule follow up at the front  desk before you leave today.   Sabino Dick, DO PGY-1 Family Medicine

## 2021-05-18 NOTE — Assessment & Plan Note (Signed)
Unfortunately patient only took Lexapro for 3 days. Unable to call this a failed trial at this time given such short duration. We discussed that these medications take time before you can see effects. Also discussed that we have room to increase dose as needed. I gave her resources to therapy/counseling again. Patient amenable to trying Lexapro again- she had no other adverse effects besides intermittent diarrhea (1-2 episodes). Given that both her PHQ-9 and GAD-7 are also worsened, will add on BusPar as well for anxiety.  -Close follow up with me 6/29 -Referral to Psychiatry -Buspar 7.5 mg BID -Restart Lexapro 10 mg  -Resources to therapy/counseling provided.

## 2021-05-23 ENCOUNTER — Telehealth: Payer: Self-pay

## 2021-05-23 NOTE — Telephone Encounter (Signed)
   Telephone encounter was:  Successful.  05/23/2021 Name: Christine Kaiser MRN: [id number] DOB: June 24, 1991  Christine Kaiser is a 30 y.o. year old female who is a primary care patient of Sabino Dick, DO . The community resource team was consulted for assistance with Transportation Needs   Care guide performed the following interventions: Spoke with patient about [facility] transportation. Patient stated that she has the information to call for transportation to her medical appointments. She stated that she does not have any other needs at this time..  Follow Up Plan:  No further follow up planned at this time. The patient has been provided with needed resources.  Pressley Tadesse, AAS Paralegal, [facility] Care Guide  Embedded Care Coordination [place]  [facility]  [address] [place], [place] [address] ??[email or web address]  ?? [phone]   [email or web address]

## 2021-05-30 ENCOUNTER — Encounter: Payer: Self-pay | Admitting: Family Medicine

## 2021-05-30 ENCOUNTER — Ambulatory Visit (INDEPENDENT_AMBULATORY_CARE_PROVIDER_SITE_OTHER): Payer: Medicare Other | Admitting: Family Medicine

## 2021-05-30 ENCOUNTER — Other Ambulatory Visit: Payer: Self-pay

## 2021-05-30 DIAGNOSIS — L509 Urticaria, unspecified: Secondary | ICD-10-CM | POA: Diagnosis not present

## 2021-05-30 DIAGNOSIS — F419 Anxiety disorder, unspecified: Secondary | ICD-10-CM | POA: Diagnosis present

## 2021-05-30 MED ORDER — HYDROCORTISONE 1 % EX OINT: 1.0000 "application " | TOPICAL_OINTMENT | Freq: Two times a day (BID) | CUTANEOUS | 0 refills | Status: AC

## 2021-05-30 MED ORDER — ESCITALOPRAM OXALATE 20 MG PO TABS: 20.0000 mg | ORAL_TABLET | Freq: Every day | ORAL | 0 refills | Status: DC

## 2021-05-30 NOTE — Assessment & Plan Note (Addendum)
Patient is fortunately tolerating the Lexapro better now.  She appears to have had an allergic reaction to something, question if it was actually BuSpar.  Regardless, we will stop BuSpar.  We will increase Lexapro to 20 mg tablets.  Patient will follow-up with me in 1 month (was the next available appointment and patient elected to keep appointment with me versus another provider).  Encouraged her to call for therapy/counseling.  PHQ-9 with slight improvement from 13 to 8.  GAD-7 with slight improvement from 19 to 13.

## 2021-05-30 NOTE — Patient Instructions (Addendum)
It was wonderful to see you today.  Please bring ALL of your medications with you to every visit.   I am glad that your depression and anxiety are slowly improving. We will increase your Lexapro to 20 mg daily.  I will see you back in 2 weeks to follow-up.  For your rash, you can take over the counter benadryl and apply hydrocortisone ointment to the areas. If they persist and/or worsen with this, please come back.   Thank you for choosing [facility].   Please call [phone] with any questions about today's appointment.  Please be sure to schedule follow up at the front  desk before you leave today.   Sabino Dick, DO PGY-1 Family Medicine

## 2021-05-30 NOTE — Progress Notes (Signed)
SUBJECTIVE:   CHIEF COMPLAINT / HPI:   Anxiety/Depression Follow Up Patient seen today for follow up on anxiety and depression. At last visit she was started on BusPar 7.5 mg BID and told to restart Lexapro 10 mg daily as she had previously only taken this for 3-days. Today she feels that her anxiety is slightly improved. She is tolerating the Lexapro without adverse effects.  She took the BuSpar for about 1 and half weeks but then started to break out with a rash.  No abdominal pain, difficulty breathing, swelling.  No history of other drug allergies.  She last took this medication about 1 week ago.  Has not been able to call for therapy/counseling. Referral to Psychiatry was also placed at last visit, she has not heard from them yet.    PERTINENT  PMH / PSH:  Past Medical History:  Diagnosis Date   Anxiety and depression    Cystocele, unspecified (CODE)     OBJECTIVE:   BP 123/71   Pulse 96   Wt 183 lb 3.2 oz (83.1 kg)   LMP 05/07/2021   SpO2 99%   BMI 37.00 kg/m    General: NAD, pleasant, able to participate in exam Respiratory: Breathing comfortably on room air, no respiratory distress Extremities: no edema or cyanosis. Skin: Multiple scattered urticarial patches on face, chest, and bilateral arms (pictures below) Psych: Normal affect and mood; less-anxious appearing today       Depression screen [facility] 2/9 05/30/2021 05/18/2021 05/07/2021  Decreased Interest 0 2 2  Down, Depressed, Hopeless 3 1 1   PHQ - 2 Score 3 3 3   Altered sleeping 3 1 1   Tired, decreased energy 1 1 1   Change in appetite 0 2 0  Feeling bad or failure about yourself  0 2 2  Trouble concentrating 1 2 0  Moving slowly or fidgety/restless 0 2 0  Suicidal thoughts 0 0 0  PHQ-9 Score 8 13 7   Difficult doing work/chores Somewhat difficult - Not difficult at all   GAD 7 : Generalized Anxiety Score 05/30/2021 05/18/2021 05/07/2021 11/24/2019  Nervous, Anxious, on Edge 3 3 3 1   Control/stop worrying 3 3 3  1   Worry too much - different things 3 3 3 1   Trouble relaxing 1 3 3 1   Restless 1 2 2  0  Easily annoyed or irritable 1 3 2 1   Afraid - awful might happen 1 2 1  0  Total GAD 7 Score 13 19 17 5   Anxiety Difficulty Not difficult at all - Not difficult at all -     ASSESSMENT/PLAN:   Anxiety Patient is fortunately tolerating the Lexapro better now.  She appears to have had an allergic reaction to something, question if it was actually BuSpar.  Regardless, we will stop BuSpar.  We will increase Lexapro to 20 mg tablets.  Patient will follow-up with me in 1 month (was the next available appointment and patient elected to keep appointment with me versus another provider).  Encouraged her to call for therapy/counseling.  PHQ-9 with slight improvement from 13 to 8.  GAD-7 with slight improvement from 19 to 13.  Urticaria Present on face, chest, bilateral arms.  Itchy.  Question if related to BuSpar as patient reports new areas are appearing and she has been off BuSpar for the last week.  Discussed signs/symptoms of anaphylaxis and return precautions.  -OTC Benadryl -1% hydrocortisone to use to affected areas.     , DO [place] [facility]  [place]

## 2021-05-30 NOTE — Assessment & Plan Note (Signed)
Present on face, chest, bilateral arms.  Itchy.  Question if related to BuSpar as patient reports new areas are appearing and she has been off BuSpar for the last week.  Discussed signs/symptoms of anaphylaxis and return precautions.  -OTC Benadryl -1% hydrocortisone to use to affected areas.

## 2021-06-21 NOTE — Progress Notes (Deleted)
    SUBJECTIVE:   CHIEF COMPLAINT / HPI:   Anxiety F/U Patient presents today for follow up on anxiety management. She was last seen on 6/29 and her Lexapro was increased from 10 mg to 20 mg. BusPar was d/c at that time due to concern for possible skin reaction. She feels ***. She has*** called to schedule therapy.   PERTINENT  PMH / PSH:  Past Medical History:  Diagnosis Date   Anxiety and depression    Cystocele, unspecified (CODE)     OBJECTIVE:   There were no vitals taken for this visit.   General: NAD, pleasant, able to participate in exam Cardiac: RRR, no murmurs. Respiratory: CTAB, normal effort, No wheezes, rales or rhonchi Abdomen: Bowel sounds present, nontender, nondistended, no hepatosplenomegaly. Extremities: no edema or cyanosis. Skin: warm and dry, no rashes noted Neuro: alert, no obvious focal deficits Psych: Normal affect and mood  ASSESSMENT/PLAN:   No problem-specific Assessment & Plan notes found for this encounter.     Sabino Dick, DO [place] [facility]

## 2021-06-22 ENCOUNTER — Ambulatory Visit: Payer: Medicare Other | Admitting: Family Medicine

## 2021-07-27 ENCOUNTER — Encounter (HOSPITAL_COMMUNITY): Payer: Self-pay

## 2021-07-27 ENCOUNTER — Emergency Department (HOSPITAL_COMMUNITY)
Admission: EM | Admit: 2021-07-27 | Discharge: 2021-07-27 | Disposition: A | Discharge status: Home / Self Care | Payer: Medicare Other | Attending: Emergency Medicine | Admitting: Emergency Medicine

## 2021-07-27 ENCOUNTER — Emergency Department (HOSPITAL_COMMUNITY): Payer: Medicare Other

## 2021-07-27 ENCOUNTER — Other Ambulatory Visit: Payer: Self-pay

## 2021-07-27 DIAGNOSIS — N939 Abnormal uterine and vaginal bleeding, unspecified: Secondary | ICD-10-CM | POA: Diagnosis not present

## 2021-07-27 DIAGNOSIS — N9489 Other specified conditions associated with female genital organs and menstrual cycle: Secondary | ICD-10-CM | POA: Diagnosis not present

## 2021-07-27 DIAGNOSIS — F17210 Nicotine dependence, cigarettes, uncomplicated: Secondary | ICD-10-CM | POA: Insufficient documentation

## 2021-07-27 DIAGNOSIS — R1031 Right lower quadrant pain: Secondary | ICD-10-CM | POA: Insufficient documentation

## 2021-07-27 DIAGNOSIS — K625 Hemorrhage of anus and rectum: Secondary | ICD-10-CM | POA: Insufficient documentation

## 2021-07-27 LAB — COMPREHENSIVE METABOLIC PANEL
ALT: 17 U/L (ref 0–44)
AST: 21 U/L (ref 15–41)
Albumin: 4.2 g/dL (ref 3.5–5.0)
Alkaline Phosphatase: 67 U/L (ref 38–126)
Anion gap: 8 (ref 5–15)
BUN: 12 mg/dL (ref 6–20)
CO2: 24 mmol/L (ref 22–32)
Calcium: 9 mg/dL (ref 8.9–10.3)
Chloride: 103 mmol/L (ref 98–111)
Creatinine, Ser: 0.68 mg/dL (ref 0.44–1.00)
GFR, Estimated: >60 mL/min (ref >60)
Glucose, Bld: 92 mg/dL (ref 70–99)
Potassium: 3.9 mmol/L (ref 3.5–5.1)
Sodium: 135 mmol/L (ref 135–145)
Total Bilirubin: 0.5 mg/dL (ref 0.3–1.2)
Total Protein: 7.8 g/dL (ref 6.5–8.1)

## 2021-07-27 LAB — CBC WITH DIFFERENTIAL/PLATELET
Abs Immature Granulocytes: 0.03 10*3/uL (ref 0.00–0.07)
Basophils Absolute: 0 10*3/uL (ref 0.0–0.1)
Basophils Relative: 0 %
Eosinophils Absolute: 0.1 10*3/uL (ref 0.0–0.5)
Eosinophils Relative: 1 %
HCT: 40.3 % (ref 36.0–46.0)
Hemoglobin: 13.2 g/dL (ref 12.0–15.0)
Immature Granulocytes: 0 %
Lymphocytes Relative: 13 %
Lymphs Abs: 1.6 10*3/uL (ref 0.7–4.0)
MCH: 29.8 pg (ref 26.0–34.0)
MCHC: 32.8 g/dL (ref 30.0–36.0)
MCV: 91 fL (ref 80.0–100.0)
Monocytes Absolute: 0.8 10*3/uL (ref 0.1–1.0)
Monocytes Relative: 6 %
Neutro Abs: 9.8 10*3/uL — ABNORMAL HIGH (ref 1.7–7.7)
Neutrophils Relative %: 80 %
Platelets: 206 10*3/uL (ref 150–400)
RBC: 4.43 MIL/uL (ref 3.87–5.11)
RDW: 14.4 % (ref 11.5–15.5)
WBC: 12.4 10*3/uL — ABNORMAL HIGH (ref 4.0–10.5)
nRBC: 0 % (ref 0.0–0.2)

## 2021-07-27 LAB — URINALYSIS, ROUTINE W REFLEX MICROSCOPIC
Bacteria, UA: NONE SEEN
Bilirubin Urine: NEGATIVE
Glucose, UA: NEGATIVE mg/dL
Ketones, ur: 20 mg/dL — AB
Leukocytes,Ua: NEGATIVE
Nitrite: NEGATIVE
Protein, ur: NEGATIVE mg/dL
Specific Gravity, Urine: 1.039 — ABNORMAL HIGH (ref 1.005–1.030)
pH: 5 (ref 5.0–8.0)

## 2021-07-27 LAB — I-STAT BETA HCG BLOOD, ED (MC, WL, AP ONLY): I-stat hCG, quantitative: <5 m[IU]/mL (ref <5)

## 2021-07-27 LAB — POC OCCULT BLOOD, ED: Fecal Occult Bld: NEGATIVE

## 2021-07-27 LAB — TYPE AND SCREEN
ABO/RH(D): O POS
Antibody Screen: NEGATIVE

## 2021-07-27 MED ORDER — HYDROCODONE-ACETAMINOPHEN 5-325 MG PO TABS: 1.0000 | ORAL_TABLET | ORAL | 0 refills | Status: DC | PRN

## 2021-07-27 MED ORDER — METHOCARBAMOL 500 MG PO TABS: 500.0000 mg | ORAL_TABLET | Freq: Two times a day (BID) | ORAL | 0 refills | Status: DC

## 2021-07-27 MED ORDER — SODIUM CHLORIDE 0.9 % IV SOLN: INTRAVENOUS | Status: DC

## 2021-07-27 MED ORDER — IOHEXOL 350 MG/ML SOLN
80.0000 mL | Freq: Once | INTRAVENOUS | Status: AC | PRN
  Administered 2021-07-27: 80 mL via INTRAVENOUS

## 2021-07-27 MED ORDER — MORPHINE SULFATE (PF) 4 MG/ML IV SOLN
4.0000 mg | Freq: Once | INTRAVENOUS | Status: AC
  Administered 2021-07-27: 4 mg via INTRAVENOUS
  Filled 2021-07-27: qty 1

## 2021-07-27 NOTE — ED Triage Notes (Signed)
Pt reports black stool, bloating, and RLQ pain for 3-4 days. Pt also reports continued vaginal bleeding after finishing her period.

## 2021-07-27 NOTE — ED Provider Notes (Addendum)
[facility]-EMERGENCY DEPT Provider Note   CSN: [id number] Arrival date & time: 07/27/21  0831     History Chief Complaint  Patient presents with   Rectal Bleeding   Abdominal Pain    Christine Kaiser is a 30 y.o. female.  30 year old female presents with right lower quadrant abdominal pain with dark stools.  Patient states that she took Pepto-Bismol recently.  Denies any hematemesis.  No use of NSAIDs.  No history of GI bleeding.  Does not take any blood thinners.  Has been having irregular vaginal bleeding for quite some time and states that she started bleeding recently.  Denies any vaginal discharge.      Past Medical History:  Diagnosis Date   Anxiety and depression    Cystocele, unspecified (CODE)     Patient Active Problem List   Diagnosis Date Noted   Urticaria 05/30/2021   Anxiety and depression 05/18/2021   Family history of FAP (familial adenomatous polyposis) 05/18/2021   Assistance needed with transportation 05/18/2021   Anxiety 05/07/2021   Family history of colon cancer 05/07/2021   Healthcare maintenance 05/07/2021   Gestational diabetes mellitus (GDM) affecting pregnancy, antepartum 12/23/2019   Cystocele with prolapse 08/31/2019   Supervision of other normal pregnancy, antepartum 08/17/2019   Tobacco smoking affecting pregnancy, antepartum 08/17/2019    Past Surgical History:  Procedure Laterality Date   WISDOM TOOTH EXTRACTION       OB History     Gravida  2   Para  2   Term  2   Preterm      AB      Living  2      SAB      IAB      Ectopic      Multiple  0   Live Births  2           Family History  Problem Relation Age of Onset   Colon cancer Mother    Sleep apnea Father    Diabetes Father    Hyperlipidemia Maternal Grandmother    Diabetes Maternal Grandmother    Colon cancer Maternal Grandfather    Colon cancer Maternal Aunt     Social History   Tobacco Use   Smoking status: Every  Day    Packs/day: 0.50    Types: Cigarettes   Smokeless tobacco: Never   Tobacco comments:    1 pack every 3 days   Vaping Use   Vaping Use: Never used  Substance Use Topics   Alcohol use: Not Currently   Drug use: Not Currently    Types: Marijuana    Home Medications Prior to Admission medications   Medication Sig Start Date End Date Taking? Authorizing Provider  escitalopram (LEXAPRO) 20 MG tablet Take 1 tablet (20 mg total) by mouth daily. 05/30/21   Sabino Dick, DO    Allergies    Buspar [buspirone] and Coconut oil  Review of Systems   Review of Systems  All other systems reviewed and are negative.  Physical Exam Updated Vital Signs BP 124/64 (BP Location: Left Arm)   Pulse 98   Temp 98.8 F (37.1 C) (Oral)   Resp 18   Ht 1.499 m (4\' 11" )   Wt 81.6 kg   SpO2 98%   BMI 36.36 kg/m   Physical Exam Vitals and nursing note reviewed.  Constitutional:      General: She is not in acute distress.    Appearance: Normal appearance. She is  well-developed. She is not toxic-appearing.  HENT:     Head: Normocephalic and atraumatic.  Eyes:     General: Lids are normal.     Conjunctiva/sclera: Conjunctivae normal.     Pupils: Pupils are equal, round, and reactive to light.  Neck:     Thyroid: No thyroid mass.     Trachea: No tracheal deviation.  Cardiovascular:     Rate and Rhythm: Normal rate and regular rhythm.     Heart sounds: Normal heart sounds. No murmur heard.   No gallop.  Pulmonary:     Effort: Pulmonary effort is normal. No respiratory distress.     Breath sounds: Normal breath sounds. No stridor. No decreased breath sounds, wheezing, rhonchi or rales.  Abdominal:     General: There is no distension.     Palpations: Abdomen is soft.     Tenderness: There is no abdominal tenderness. There is no rebound.  Genitourinary:    Comments: Dark stool Musculoskeletal:        General: No tenderness. Normal range of motion.     Cervical back: Normal range  of motion and neck supple.  Skin:    General: Skin is warm and dry.     Findings: No abrasion or rash.  Neurological:     Mental Status: She is alert and oriented to person, place, and time. Mental status is at baseline.     GCS: GCS eye subscore is 4. GCS verbal subscore is 5. GCS motor subscore is 6.     Cranial Nerves: Cranial nerves are intact. No cranial nerve deficit.     Sensory: No sensory deficit.     Motor: Motor function is intact.  Psychiatric:        Attention and Perception: Attention normal.        Speech: Speech normal.        Behavior: Behavior normal.    ED Results / Procedures / Treatments   Labs (all labs ordered are listed, but only abnormal results are displayed) Labs Reviewed  CBC WITH DIFFERENTIAL/PLATELET  COMPREHENSIVE METABOLIC PANEL  I-STAT BETA HCG BLOOD, ED (MC, [facility], AP ONLY)  POC OCCULT BLOOD, ED  TYPE AND SCREEN    EKG None  Radiology No results found.  Procedures Procedures   Medications Ordered in ED Medications  0.9 %  sodium chloride infusion (has no administration in time range)  morphine 4 MG/ML injection 4 mg (has no administration in time range)    ED Course  I have reviewed the triage vital signs and the nursing notes.  Pertinent labs & imaging results that were available during my care of the patient were reviewed by me and considered in my medical decision making (see chart for details).    MDM Rules/Calculators/A&P                           Patient extensive work-up here including abdominal CT and urinalysis and blood work.  Medicated for pain here.  Patient has pinpoint tender in her right groin.  No evidence of femoral hernias.  Seems to be musculoskeletal.  Pain does not appear to be ovarian in etiology.  it has been present for 4 days.  Will discharge home with strict return precautions Final Clinical Impression(s) / ED Diagnoses Final diagnoses:  None    Rx / DC Orders ED Discharge Orders     None         Lorre Nick, MD 07/27/21  1347    Lorre Nick, MD 07/27/21 1347

## 2021-10-01 IMAGING — US US MFM OB FOLLOW-UP
1 series · 14 of 28 positions shown · non-contrast
Comparison: none

[Series 1: us mfm ob follow-up · 45 acquisitions, 14 frames shown]
[im 2/45]
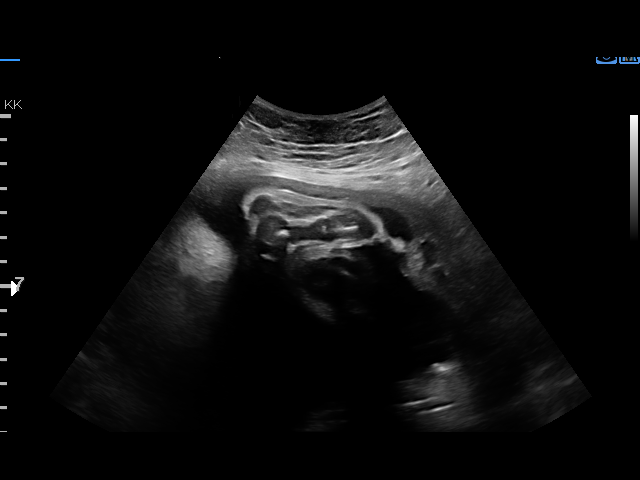
[im 5/45]
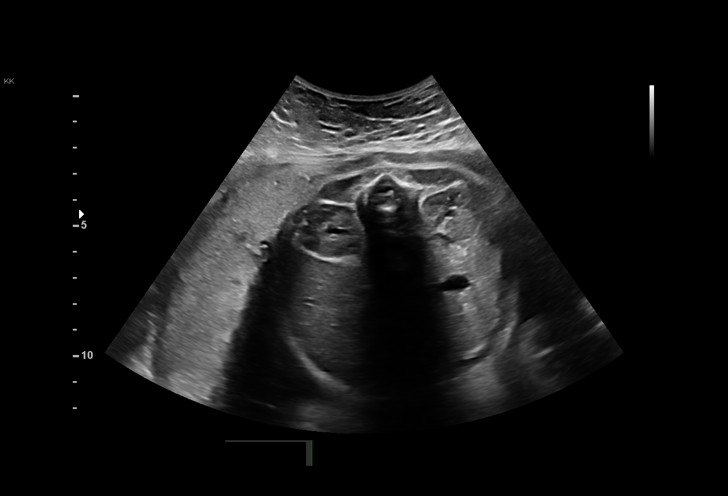
[im 9/45]
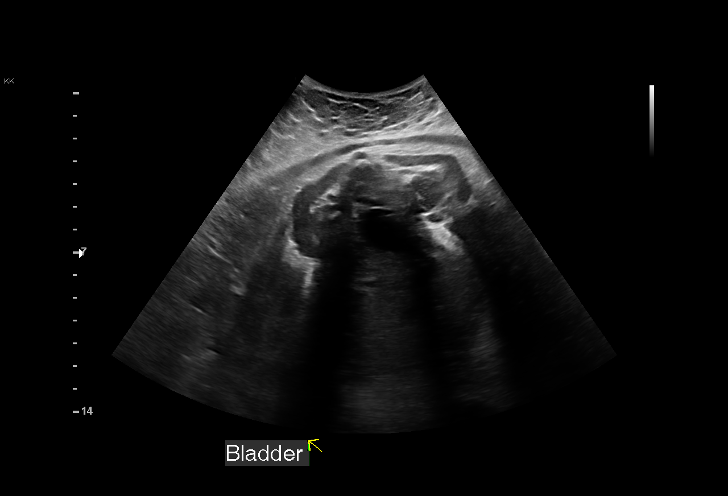
[im 12/45]
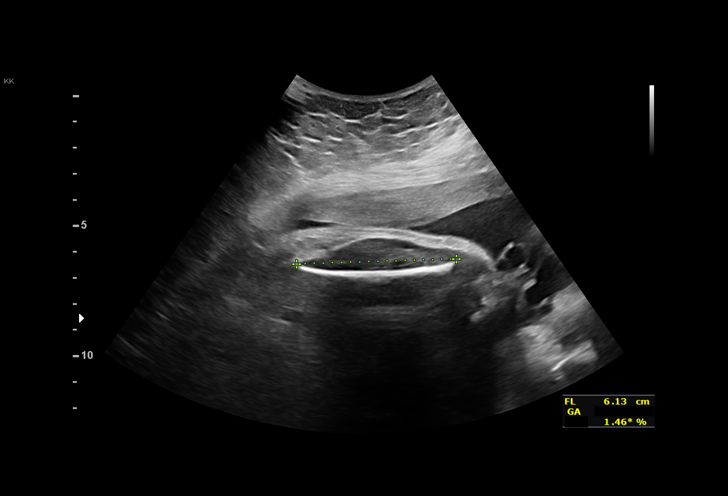
[im 15/45]
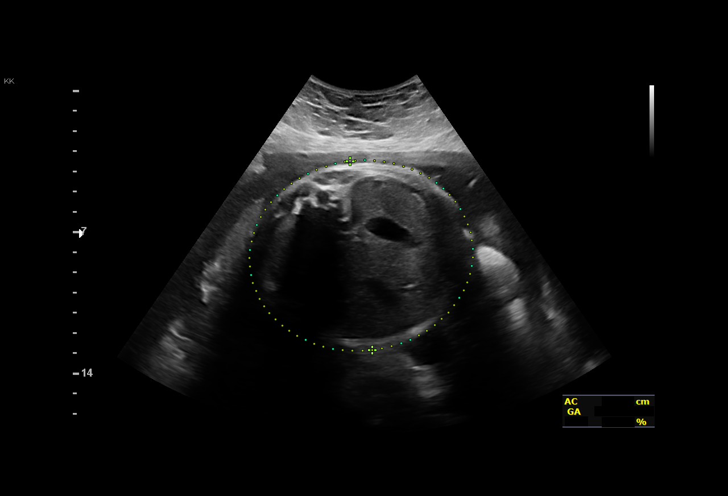
[im 18/45]
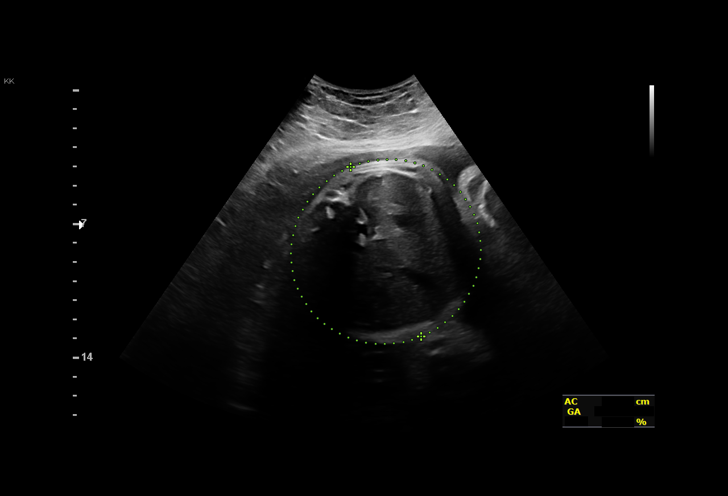
[im 22/45]
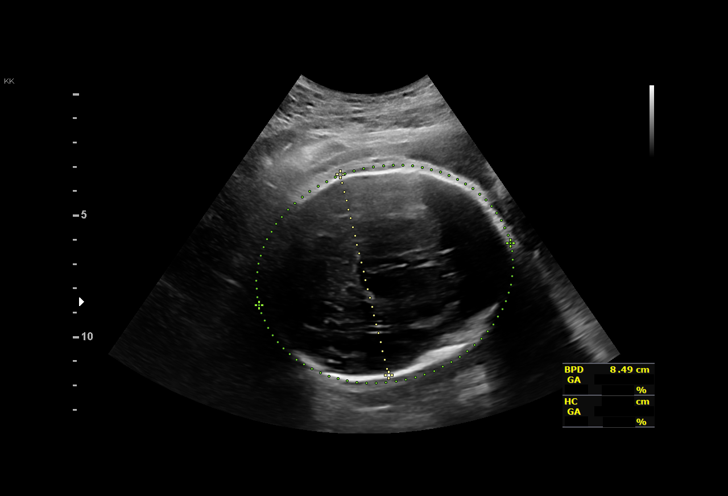
[im 25/45]
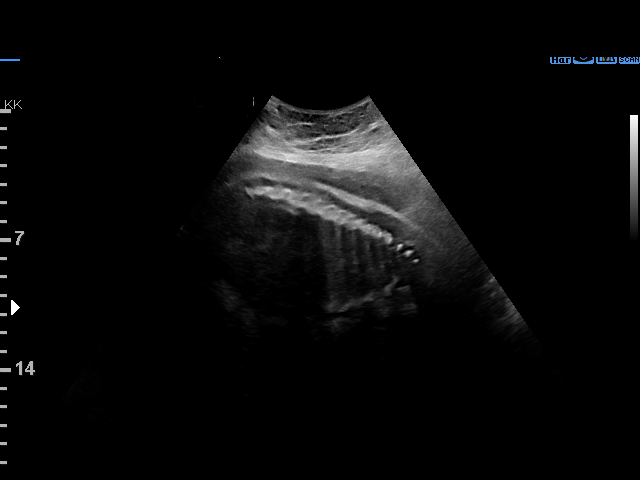
[im 28/45]
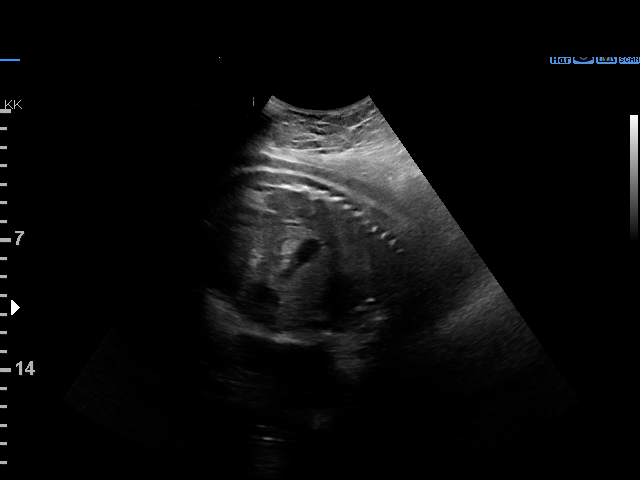
[im 31/45]
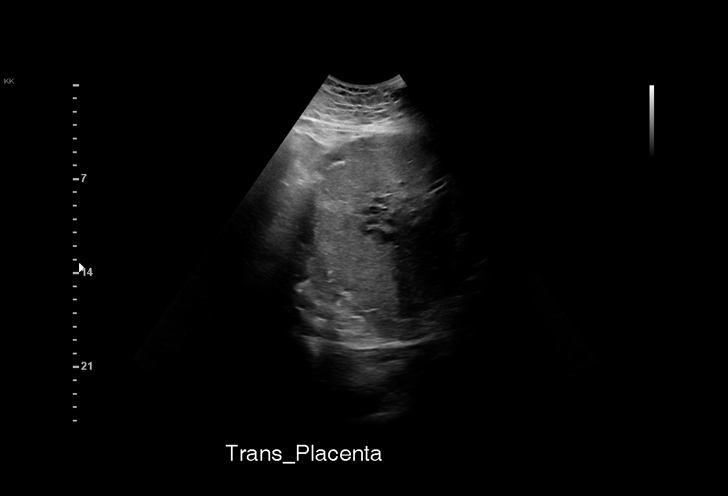
[im 35/45]
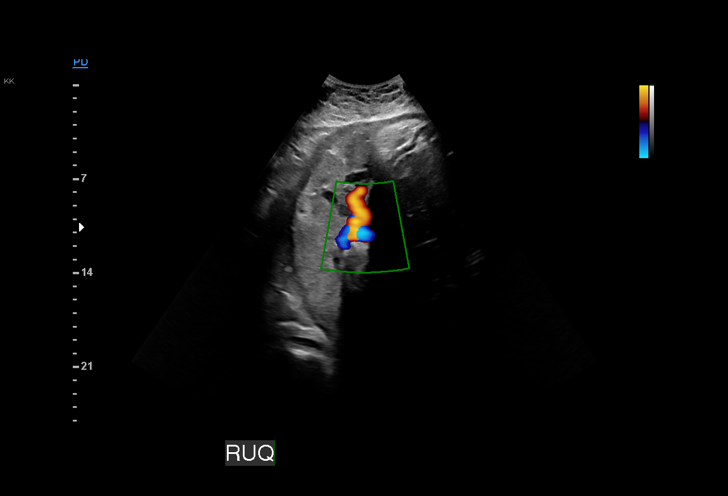
[im 38/45]
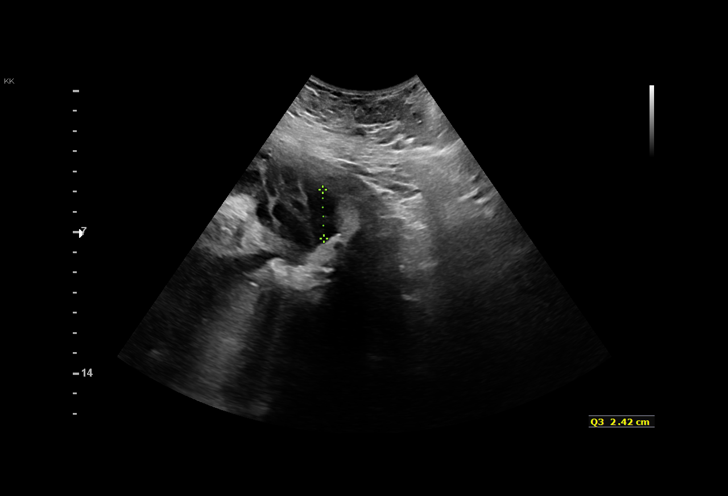
[im 41/45]
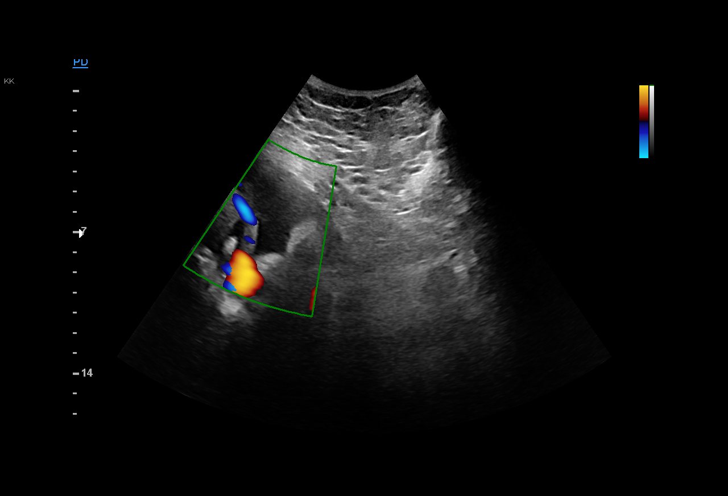
[im 45/45]
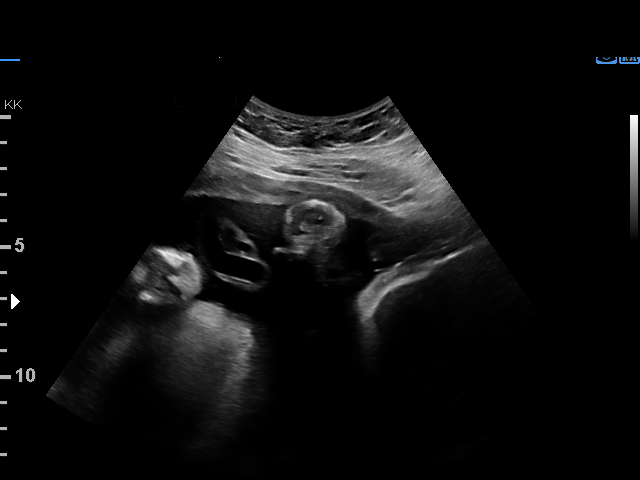

[14 of 28 positions shown; findings below may reference images not displayed]

----------------------------------------------------------------------

 ----------------------------------------------------------------------
Indications

  Obesity complicating pregnancy, third
  trimester
  Gestational diabetes in pregnancy,
  unspecified control
  Tobacco use complicating pregnancy, third
  trimester
  Low Risk NIPS ( Negative AFP)
  34 weeks gestation of pregnancy
 ----------------------------------------------------------------------
Vital Signs

 BMI:
Fetal Evaluation

 Num Of Fetuses:         1
 Fetal Heart Rate(bpm):  130
 Cardiac Activity:       Observed
 Presentation:           Cephalic
 Placenta:               Posterior
 P. Cord Insertion:      Previously Visualized

 Amniotic Fluid
 AFI FV:      Within normal limits

 AFI Sum(cm)     %Tile       Largest Pocket(cm)
 9.34            15

 RUQ(cm)                     LUQ(cm)        LLQ(cm)

Biometry

 BPD:      84.5  mm     G. Age:  34w 0d         34  %    CI:        77.79   %    70 - 86
                                                         FL/HC:      20.5   %    20.1 -
 HC:      303.2  mm     G. Age:  33w 5d          5  %    HC/AC:      0.97        0.93 -
 AC:      312.2  mm     G. Age:  35w 1d         72  %    FL/BPD:     73.7   %    71 - 87
 FL:       62.3  mm     G. Age:  32w 2d          3  %    FL/AC:      20.0   %    20 - 24

 Est. FW:    3101  gm      5 lb 3 oz     32  %
OB History

 Gravidity:    2         Term:   1        Prem:   0        SAB:   0
 TOP:          0       Ectopic:  0        Living: 1
Gestational Age

 LMP:           34w 0d        Date:  06/07/19                 EDD:   03/13/20
 U/S Today:     33w 6d                                        EDD:   03/14/20
 Best:          34w 4d     Det. By:  Early Ultrasound         EDD:   03/09/20
                                     (09/08/19)
Anatomy

 Cranium:               Appears normal         Aortic Arch:            Previously seen
 Cavum:                 Appears normal         Ductal Arch:            Previously seen
 Ventricles:            Appears normal         Diaphragm:              Previously seen
 Choroid Plexus:        Previously seen        Stomach:                Appears normal, left
                                                                       sided
 Cerebellum:            Previously seen        Abdomen:                Appears normal
 Posterior Fossa:       Previously seen        Abdominal Wall:         Previously seen
 Nuchal Fold:           Previously seen        Cord Vessels:           Previously seen
 Face:                  Orbits and profile     Kidneys:                Appear normal
                        previously seen
 Lips:                  Previously seen        Bladder:                Appears normal
 Thoracic:              Appears normal         Spine:                  Previously seen
 Heart:                 Appears normal         Upper Extremities:      Previously seen
                        (4CH, axis, and
                        situs)
 RVOT:                  Previously seen        Lower Extremities:      Previously seen
 LVOT:                  Previously seen

 Other:  Lt heel, 5th digits, 3VV previously visualized. Parents do not wish to
         know sex of fetus. Technically difficult due to maternal habitus.
Cervix Uterus Adnexa

 Cervix
 Not visualized (advanced GA >96wks)
Impression

 Follow up growth given a diagnosis of obesity
 Normal interval growth
 Low risk NIPS
 PMN8X
 Previously resolved placenta previa
Recommendations

 Follow up growth as clinically indicated.

## 2021-10-08 ENCOUNTER — Encounter: Payer: Self-pay | Admitting: Nurse Practitioner

## 2021-10-30 ENCOUNTER — Ambulatory Visit: Payer: Medicare Other | Admitting: Nurse Practitioner

## 2021-11-05 ENCOUNTER — Encounter: Payer: Self-pay | Admitting: Family Medicine

## 2022-01-21 ENCOUNTER — Ambulatory Visit
Admission: EM | Admit: 2022-01-21 | Discharge: 2022-01-21 | Disposition: A | Discharge status: Home / Self Care | Payer: Medicare Other | Attending: Emergency Medicine | Admitting: Emergency Medicine

## 2022-01-21 ENCOUNTER — Other Ambulatory Visit: Payer: Self-pay

## 2022-01-21 DIAGNOSIS — M545 Low back pain, unspecified: Secondary | ICD-10-CM

## 2022-01-21 DIAGNOSIS — M62830 Muscle spasm of back: Secondary | ICD-10-CM | POA: Diagnosis not present

## 2022-01-21 MED ORDER — BACLOFEN 10 MG PO TABS: 10.0000 mg | ORAL_TABLET | Freq: Three times a day (TID) | ORAL | 0 refills | Status: AC

## 2022-01-21 MED ORDER — KETOROLAC TROMETHAMINE 60 MG/2ML IM SOLN
60.0000 mg | Freq: Once | INTRAMUSCULAR | Status: AC
  Administered 2022-01-21: 60 mg via INTRAMUSCULAR

## 2022-01-21 MED ORDER — METHYLPREDNISOLONE 4 MG PO TABS: ORAL_TABLET | ORAL | 0 refills | Status: AC

## 2022-01-21 MED ORDER — ACETAMINOPHEN 500 MG PO TABS: 1000.0000 mg | ORAL_TABLET | Freq: Three times a day (TID) | ORAL | 0 refills | Status: AC

## 2022-01-21 NOTE — Discharge Instructions (Addendum)
The treatment for lower back pain is multifactorial.  It incorporates the use of anti-inflammatories, analgesics and relaxants.  During your visit today, you received an injection of ketorolac, high-dose nonsteroidal anti-inflammatory pain medication that should significantly reduce your pain for the next 6 to 8 hours.   This evening, you can begin taking baclofen 10 mg.  This is a highly effective muscle relaxer and antispasmodic which should continue to provide you with relaxation of your tense muscles, allow you to sleep well and to keep your pain under control.  You can continue taking this medication 3 times daily as you need to.  If you find that this medication makes you too sleepy, you can break them in half for your daytime doses and, if needed double them for your nighttime dose.  Do not take more than 30 mg of baclofen in a 24-hour period.  Also this evening, begin taking Tylenol 1000 mg 3 times daily.  Tylenol is an interesting analgesic medication, this means that the medication convinces your brain to ignore the pain that you are experiencing.  You are no longer experiencing pain, muscle tension tends to release providing you with another aspect of pain relief.   Tomorrow morning, please begin taking methylprednisolone, a steroid that reduces the full inflammation of the tendons, ligaments and muscles in your lower back.  Please take all tablets of the daily recommended dose with your breakfast meal every morning for the next 8 days.  Every day you will take 1 less tablet than the day before.  If you have had significant relation of your pain before you finish the entire prescription, please feel free to discontinue.  It is not important to finish every dose.   During the day, please set aside time to apply ice to the affected area 4 times daily for 20 minutes each application.  This can be achieved by using a bag of frozen peas or corn, a Ziploc bag filled with ice and water, or Ziploc bag  filled with half rubbing alcohol and half Dawn dish detergent, frozen into a slush.  Please be careful not to apply ice directly to your skin, always place a soft cloth between you and the ice pack.   You are welcome to use topical anti-inflammatory creams such as Voltaren gel, Capsaicin or Aspercreme as recommended in the product instructions.   Please avoid attempts to stretch or strengthen the affected area until you are feeling completely pain-free.  Attempts to do so will only prolong the healing process.   If you would like to try to return return to urgent care in the next 2 to 3 days for repeat ketorolac injection, you are welcome to do so.   I also recommend that you remain out of work for the next several days, I provided you with a note to return to work in 3 days.  If you feel that you need this time extended, please follow-up with your primary care provider or return to urgent care for reevaluation so that we can provide you with a note for another 3 days.  Thank you for visiting urgent care today.  We appreciate the opportunity to participate in your care.

## 2022-01-21 NOTE — ED Triage Notes (Signed)
Pt reports a h/o scoliosis she reports having a flare-up with some back pain x 2 weeks. She is taking OTC medication with no relief.

## 2022-01-21 NOTE — ED Provider Notes (Signed)
[facility]    CSN: [id number] Arrival date & time: 01/21/22  1104    HISTORY   Chief Complaint  Patient presents with   Back Pain   HPI Christine Kaiser is a 31 y.o. female. Patient presents to urgent care today with a chief complaint of lower back pain.  Patient reports a history of scoliosis and lower back pain in the past, states she was doing well until she went to a trampoline park 2 weeks ago.  Patient states during her visit, she began to have a twinge of back pain and states that the pain has been continuously worsening ever since.  Patient states she is unable to stand, walk, lie down, bend forward.  States she is only able to sit without significant pain.  Patient states she is also unable to pick up her 30-year-old daughter.  Patient states she tried ibuprofen 800 mg but states this barely touches her pain at all.  Patient states has been prescribed muscle relaxers in the past which she states she has done well on.  EMR reviewed, they do not see any prior imaging of her spine.  The history is provided by the patient.  Past Medical History:  Diagnosis Date   Anxiety and depression    Cystocele, unspecified (CODE)    Patient Active Problem List   Diagnosis Date Noted   Urticaria 05/30/2021   Anxiety and depression 05/18/2021   Family history of FAP (familial adenomatous polyposis) 05/18/2021   Assistance needed with transportation 05/18/2021   Anxiety 05/07/2021   Family history of colon cancer 05/07/2021   Healthcare maintenance 05/07/2021   Gestational diabetes mellitus (GDM) affecting pregnancy, antepartum 12/23/2019   Cystocele with prolapse 08/31/2019   Supervision of other normal pregnancy, antepartum 08/17/2019   Tobacco smoking affecting pregnancy, antepartum 08/17/2019   Past Surgical History:  Procedure Laterality Date   WISDOM TOOTH EXTRACTION     OB History     Gravida  2   Para  2   Term  2   Preterm      AB      Living  2       SAB      IAB      Ectopic      Multiple  0   Live Births  2          Home Medications    Prior to Admission medications   Medication Sig Start Date End Date Taking? Authorizing Provider  acetaminophen (TYLENOL) 500 MG tablet Take 2,000 mg by mouth every 6 (six) hours as needed for mild pain or headache.    [provider]  escitalopram (LEXAPRO) 20 MG tablet Take 1 tablet (20 mg total) by mouth daily. 05/30/21   Sabino DickEspinoza, Alejandra, DO  HYDROcodone-acetaminophen (NORCO/VICODIN) 5-325 MG tablet Take 1 tablet by mouth every 4 (four) hours as needed. 07/27/21   Lorre NickAllen, Anthony, MD  hydrOXYzine (VISTARIL) 25 MG capsule Take 25-50 mg by mouth 3 (three) times daily as needed for anxiety.    [provider]  ibuprofen (ADVIL) 200 MG tablet Take 800 mg by mouth every 6 (six) hours as needed for mild pain or headache.    [provider]  methocarbamol (ROBAXIN) 500 MG tablet Take 1 tablet (500 mg total) by mouth 2 (two) times daily. 07/27/21   Lorre NickAllen, Anthony, MD    Family History Family History  Problem Relation Age of Onset   Colon cancer Mother    Sleep apnea  Father    Diabetes Father    Hyperlipidemia Maternal Grandmother    Diabetes Maternal Grandmother    Colon cancer Maternal Grandfather    Colon cancer Maternal Aunt    Social History Social History   Tobacco Use   Smoking status: Every Day    Packs/day: 0.50    Types: Cigarettes   Smokeless tobacco: Never   Tobacco comments:    1 pack every 3 days   Vaping Use   Vaping Use: Never used  Substance Use Topics   Alcohol use: Not Currently   Drug use: Not Currently    Types: Marijuana   Allergies   Buspar [buspirone] and Coconut oil  Review of Systems Review of Systems Pertinent findings noted in history of present illness.   Physical Exam Triage Vital Signs ED Triage Vitals  Enc Vitals Group     BP 09/28/21 0827 (!) 147/82     Pulse Rate 09/28/21 0827 72     Resp 09/28/21 0827  18     Temp 09/28/21 0827 98.3 F (36.8 C)     Temp Source 09/28/21 0827 Oral     SpO2 09/28/21 0827 98 %     Weight --      Height --      Head Circumference --      Peak Flow --      Pain Score 09/28/21 0826 5     Pain Loc --      Pain Edu? --      Excl. in GC? --   No data found.  Updated Vital Signs BP 119/78 (BP Location: Left Arm)    Pulse 74    Temp 98.1 F (36.7 C) (Oral)    Resp 16    LMP 12/14/2021 (Approximate)    SpO2 98%   Physical Exam Vitals and nursing note reviewed.  Constitutional:      General: She is not in acute distress.    Appearance: Normal appearance. She is not ill-appearing.  HENT:     Head: Normocephalic and atraumatic.  Eyes:     General: Lids are normal.        Right eye: No discharge.        Left eye: No discharge.     Extraocular Movements: Extraocular movements intact.     Conjunctiva/sclera: Conjunctivae normal.     Right eye: Right conjunctiva is not injected.     Left eye: Left conjunctiva is not injected.  Neck:     Trachea: Trachea and phonation normal.  Cardiovascular:     Rate and Rhythm: Normal rate and regular rhythm.     Pulses: Normal pulses.     Heart sounds: Normal heart sounds. No murmur heard.   No friction rub. No gallop.  Pulmonary:     Effort: Pulmonary effort is normal. No accessory muscle usage, prolonged expiration or respiratory distress.     Breath sounds: Normal breath sounds. No stridor, decreased air movement or transmitted upper airway sounds. No decreased breath sounds, wheezing, rhonchi or rales.  Chest:     Chest wall: No tenderness.  Musculoskeletal:        General: Tenderness (Bilateral lumbar paraspinous muscles with palpable diffuse spasm) present. No swelling or signs of injury. Normal range of motion.     Cervical back: Normal range of motion and neck supple. Normal range of motion.  Lymphadenopathy:     Cervical: No cervical adenopathy.  Skin:    General: Skin is warm and dry.  Findings: No  erythema or rash.  Neurological:     General: No focal deficit present.     Mental Status: She is alert and oriented to person, place, and time.  Psychiatric:        Mood and Affect: Mood normal.        Behavior: Behavior normal.    Visual Acuity Right Eye Distance:   Left Eye Distance:   Bilateral Distance:    Right Eye Near:   Left Eye Near:    Bilateral Near:     UC Couse / Diagnostics / Procedures:    EKG  Radiology No results found.  Procedures Procedures (including critical care time)  UC Diagnoses / Final Clinical Impressions(s)   I have reviewed the triage vital signs and the nursing notes.  Pertinent labs & imaging results that were available during my care of the patient were reviewed by me and considered in my medical decision making (see chart for details).    Final diagnoses:  Spasm of lumbar paraspinous muscle  Acute bilateral low back pain without sciatica   Patient was provided with an injection of ketorolac in the office today.  Patient tolerated well.  Begin tapering dose of methylprednisolone., Take Baclofen 10 mg 3 times daily (Patient has been advised that if this makes them sleepy, they can just take this at bedtime, up to 20 mg per dose, and try breaking the tablets in half or 5 mg per dose during the day)., Apply ice pack to affected area 4 times daily for 20 minutes each time, Instructions to alternate ice and heat to affected areas 20 minutes each, Apply topical anti-inflammatory creams such as Voltaren gel, Capsaicin and Aspercreme, Avoid stretching or strengthening exercises until pain is completely resolved, and Return to urgent care in the next 2 to 3 days for repeat ketorolac injection if needed   ED Prescriptions     Medication Sig Dispense Auth. Provider   baclofen (LIORESAL) 10 MG tablet Take 1 tablet (10 mg total) by mouth 3 (three) times daily for 7 days. 21 tablet Theadora Rama Scales, PA-C   methylPREDNISolone (MEDROL) 4 MG tablet  Take 8 tablets (32 mg total) by mouth daily for 1 day, THEN 7 tablets (28 mg total) daily for 1 day, THEN 6 tablets (24 mg total) daily for 1 day, THEN 5 tablets (20 mg total) daily for 1 day, THEN 4 tablets (16 mg total) daily for 1 day, THEN 3 tablets (12 mg total) daily for 1 day, THEN 2 tablets (8 mg total) daily for 1 day, THEN 1 tablet (4 mg total) daily for 1 day. 36 tablet Theadora Rama Scales, PA-C   acetaminophen (TYLENOL) 500 MG tablet Take 2 tablets (1,000 mg total) by mouth in the morning, at noon, and at bedtime for 10 days. 60 tablet Theadora Rama Scales, PA-C      PDMP not reviewed this encounter.  Pending results:  Labs Reviewed - No data to display  Medications Ordered in UC: Medications  ketorolac (TORADOL) injection 60 mg (60 mg Intramuscular Given 01/21/22 1145)    Disposition Upon Discharge:  Condition: stable for discharge home Home: take medications as prescribed; routine discharge instructions as discussed; follow up as advised.  Patient presented with an acute illness with associated systemic symptoms and significant discomfort requiring urgent management. In my opinion, this is a condition that a prudent lay person (someone who possesses an average knowledge of health and medicine) may potentially expect to result in complications if  not addressed urgently such as respiratory distress, impairment of bodily function or dysfunction of bodily organs.   Routine symptom specific, illness specific and/or disease specific instructions were discussed with the patient and/or caregiver at length.   As such, the patient has been evaluated and assessed, work-up was performed and treatment was provided in alignment with urgent care protocols and evidence based medicine.  Patient/parent/caregiver has been advised that the patient may require follow up for further testing and treatment if the symptoms continue in spite of treatment, as clinically indicated and appropriate.  If  the patient was tested for COVID-19, Influenza and/or RSV, then the patient/parent/guardian was advised to isolate at home pending the results of his/her diagnostic coronavirus test and potentially longer if theyre positive. I have also advised pt that if his/her COVID-19 test returns positive, it's recommended to self-isolate for at least 10 days after symptoms first appeared AND until fever-free for 24 hours without fever reducer AND other symptoms have improved or resolved. Discussed self-isolation recommendations as well as instructions for household member/close contacts as per the [facility] and [place] DHHS, and also gave patient the COVID packet with this information.  Patient/parent/caregiver has been [facility] in 3-5 days if no better; to PCP or the Emergency Department if new signs and symptoms develop, or if the current signs or symptoms continue to change or worsen for further workup, evaluation and treatment as clinically indicated and appropriate  The patient will follow up with their current PCP if and as advised. If the patient does not currently have a PCP we will assist them in obtaining one.   The patient may need specialty follow up if the symptoms continue, in spite of conservative treatment and management, for further workup, evaluation, consultation and treatment as clinically indicated and appropriate.   Patient/parent/caregiver verbalized understanding and agreement of plan as discussed.  All questions were addressed during visit.  Please see discharge instructions below for further details of plan.  Discharge Instructions:   Discharge Instructions      The treatment for lower back pain is multifactorial.  It incorporates the use of anti-inflammatories, analgesics and relaxants.  During your visit today, you received an injection of ketorolac, high-dose nonsteroidal anti-inflammatory pain medication that should significantly reduce your pain for the next 6  to 8 hours.   This evening, you can begin taking baclofen 10 mg.  This is a highly effective muscle relaxer and antispasmodic which should continue to provide you with relaxation of your tense muscles, allow you to sleep well and to keep your pain under control.  You can continue taking this medication 3 times daily as you need to.  If you find that this medication makes you too sleepy, you can break them in half for your daytime doses and, if needed double them for your nighttime dose.  Do not take more than 30 mg of baclofen in a 24-hour period.  Also this evening, begin taking Tylenol 1000 mg 3 times daily.  Tylenol is an interesting analgesic medication, this means that the medication convinces your brain to ignore the pain that you are experiencing.  You are no longer experiencing pain, muscle tension tends to release providing you with another aspect of pain relief.   Tomorrow morning, please begin taking methylprednisolone, a steroid that reduces the full inflammation of the tendons, ligaments and muscles in your lower back.  Please take all tablets of the daily recommended dose with your breakfast meal every  morning for the next 8 days.  Every day you will take 1 less tablet than the day before.  If you have had significant relation of your pain before you finish the entire prescription, please feel free to discontinue.  It is not important to finish every dose.   During the day, please set aside time to apply ice to the affected area 4 times daily for 20 minutes each application.  This can be achieved by using a bag of frozen peas or corn, a Ziploc bag filled with ice and water, or Ziploc bag filled with half rubbing alcohol and half Dawn dish detergent, frozen into a slush.  Please be careful not to apply ice directly to your skin, always place a soft cloth between you and the ice pack.   You are welcome to use topical anti-inflammatory creams such as Voltaren gel, Capsaicin or Aspercreme as  recommended in the product instructions.   Please avoid attempts to stretch or strengthen the affected area until you are feeling completely pain-free.  Attempts to do so will only prolong the healing process.   If you would like to try to return return to urgent care in the next 2 to 3 days for repeat ketorolac injection, you are welcome to do so.   I also recommend that you remain out of work for the next several days, I provided you with a note to return to work in 3 days.  If you feel that you need this time extended, please follow-up with your primary care provider or return to urgent care for reevaluation so that we can provide you with a note for another 3 days.  Thank you for visiting urgent care today.  We appreciate the opportunity to participate in your care.     This office note has been dictated using Teaching laboratory technician.  Unfortunately, and despite my best efforts, this method of dictation can sometimes lead to occasional typographical or grammatical errors.  I apologize in advance if this occurs.     Theadora Rama Scales, PA-C 01/21/22 1157

## 2022-01-25 ENCOUNTER — Other Ambulatory Visit: Payer: Self-pay

## 2022-01-25 ENCOUNTER — Ambulatory Visit (HOSPITAL_BASED_OUTPATIENT_CLINIC_OR_DEPARTMENT_OTHER)
Admission: RE | Admit: 2022-01-25 | Discharge: 2022-01-25 | Disposition: A | Discharge status: Home / Self Care | Payer: Medicare Other | Source: Ambulatory Visit | Attending: Emergency Medicine | Admitting: Emergency Medicine

## 2022-01-25 ENCOUNTER — Ambulatory Visit
Admission: EM | Admit: 2022-01-25 | Discharge: 2022-01-25 | Disposition: A | Discharge status: Home / Self Care | Payer: Medicare Other | Attending: Emergency Medicine | Admitting: Emergency Medicine

## 2022-01-25 ENCOUNTER — Telehealth: Payer: Self-pay | Admitting: Emergency Medicine

## 2022-01-25 DIAGNOSIS — M545 Low back pain, unspecified: Secondary | ICD-10-CM

## 2022-01-25 DIAGNOSIS — M47816 Spondylosis without myelopathy or radiculopathy, lumbar region: Secondary | ICD-10-CM

## 2022-01-25 DIAGNOSIS — M546 Pain in thoracic spine: Secondary | ICD-10-CM | POA: Diagnosis not present

## 2022-01-25 DIAGNOSIS — M5137 Other intervertebral disc degeneration, lumbosacral region: Secondary | ICD-10-CM | POA: Insufficient documentation

## 2022-01-25 DIAGNOSIS — M47817 Spondylosis without myelopathy or radiculopathy, lumbosacral region: Secondary | ICD-10-CM | POA: Insufficient documentation

## 2022-01-25 MED ORDER — HYDROCODONE-ACETAMINOPHEN 5-325 MG PO TABS: 2.0000 | ORAL_TABLET | ORAL | 0 refills | Status: AC | PRN

## 2022-01-25 NOTE — ED Provider Notes (Signed)
[facility]    CSN: [id number] Arrival date & time: 01/25/22  1002    HISTORY   Chief Complaint  Patient presents with   Back Pain   HPI Christine Kaiser is a 31 y.o. female. "Patient was seen 3 days ago for pain in her lower back after going to trampoline park where she began to immediately feel back pain while she was jumping on a trampoline.   At that visit, she was provided with a injection of ketorolac, muscle relaxers and a tapering dose of steroids.  Patient states that ketorolac injection helped "a little bit" but not enough to feel like another one would be helpful.  Patient states the steroids made her hands and feet feel like they are swollen and numb.  Patient states today that she is now having mid back pain as well.   The history is provided by the patient.  Past Medical History:  Diagnosis Date   Anxiety and depression    Cystocele, unspecified (CODE)    Patient Active Problem List   Diagnosis Date Noted   Urticaria 05/30/2021   Anxiety and depression 05/18/2021   Family history of FAP (familial adenomatous polyposis) 05/18/2021   Assistance needed with transportation 05/18/2021   Anxiety 05/07/2021   Family history of colon cancer 05/07/2021   Healthcare maintenance 05/07/2021   Gestational diabetes mellitus (GDM) affecting pregnancy, antepartum 12/23/2019   Cystocele with prolapse 08/31/2019   Supervision of other normal pregnancy, antepartum 08/17/2019   Tobacco smoking affecting pregnancy, antepartum 08/17/2019   Past Surgical History:  Procedure Laterality Date   WISDOM TOOTH EXTRACTION     OB History     Gravida  2   Para  2   Term  2   Preterm      AB      Living  2      SAB      IAB      Ectopic      Multiple  0   Live Births  2          Home Medications    Prior to Admission medications   Medication Sig Start Date End Date Taking? Authorizing Provider  acetaminophen (TYLENOL) 500 MG tablet Take 2 tablets  (1,000 mg total) by mouth in the morning, at noon, and at bedtime for 10 days. 01/21/22 01/31/22  Theadora Rama Scales, PA-C  baclofen (LIORESAL) 10 MG tablet Take 1 tablet (10 mg total) by mouth 3 (three) times daily for 7 days. 01/21/22 01/28/22  Theadora Rama Scales, PA-C  escitalopram (LEXAPRO) 20 MG tablet Take 1 tablet (20 mg total) by mouth daily. 05/30/21   Sabino Dick, DO  methylPREDNISolone (MEDROL) 4 MG tablet Take 8 tablets (32 mg total) by mouth daily for 1 day, THEN 7 tablets (28 mg total) daily for 1 day, THEN 6 tablets (24 mg total) daily for 1 day, THEN 5 tablets (20 mg total) daily for 1 day, THEN 4 tablets (16 mg total) daily for 1 day, THEN 3 tablets (12 mg total) daily for 1 day, THEN 2 tablets (8 mg total) daily for 1 day, THEN 1 tablet (4 mg total) daily for 1 day. 01/21/22 01/29/22  Theadora Rama Scales, PA-C    Family History Family History  Problem Relation Age of Onset   Colon cancer Mother    Sleep apnea Father    Diabetes Father    Hyperlipidemia Maternal Grandmother    Diabetes Maternal Grandmother    Colon  cancer Maternal Grandfather    Colon cancer Maternal Aunt    Social History Social History   Tobacco Use   Smoking status: Every Day    Packs/day: 0.50    Types: Cigarettes   Smokeless tobacco: Never   Tobacco comments:    1 pack every 3 days   Vaping Use   Vaping Use: Never used  Substance Use Topics   Alcohol use: Not Currently   Drug use: Not Currently    Types: Marijuana   Allergies   Buspar [buspirone] and Coconut oil  Review of Systems Review of Systems Pertinent findings noted in history of present illness.   Physical Exam Triage Vital Signs ED Triage Vitals  Enc Vitals Group     BP 09/28/21 0827 (!) 147/82     Pulse Rate 09/28/21 0827 72     Resp 09/28/21 0827 18     Temp 09/28/21 0827 98.3 F (36.8 C)     Temp Source 09/28/21 0827 Oral     SpO2 09/28/21 0827 98 %     Weight --      Height --      Head Circumference  --      Peak Flow --      Pain Score 09/28/21 0826 5     Pain Loc --      Pain Edu? --      Excl. in GC? --   No data found.  Updated Vital Signs BP 118/75 (BP Location: Right Arm)    Pulse (!) 58    Temp 97.9 F (36.6 C) (Oral)    Resp 20    LMP 01/24/2022    SpO2 98%   Physical Exam Vitals and nursing note reviewed.  Constitutional:      General: She is not in acute distress.    Appearance: Normal appearance. She is not ill-appearing.  HENT:     Head: Normocephalic and atraumatic.  Eyes:     General: Lids are normal.        Right eye: No discharge.        Left eye: No discharge.     Extraocular Movements: Extraocular movements intact.     Conjunctiva/sclera: Conjunctivae normal.     Right eye: Right conjunctiva is not injected.     Left eye: Left conjunctiva is not injected.  Neck:     Trachea: Trachea and phonation normal.  Cardiovascular:     Rate and Rhythm: Normal rate and regular rhythm.     Pulses: Normal pulses.     Heart sounds: Normal heart sounds. No murmur heard.   No friction rub. No gallop.  Pulmonary:     Effort: Pulmonary effort is normal. No accessory muscle usage, prolonged expiration or respiratory distress.     Breath sounds: Normal breath sounds. No stridor, decreased air movement or transmitted upper airway sounds. No decreased breath sounds, wheezing, rhonchi or rales.  Chest:     Chest wall: No tenderness.  Musculoskeletal:        General: Tenderness (Bilateral lumbar and thoracic paraspinous muscles with no palpable spasm) present. No swelling or signs of injury. Normal range of motion.     Cervical back: Normal range of motion and neck supple. Normal range of motion.  Lymphadenopathy:     Cervical: No cervical adenopathy.  Skin:    General: Skin is warm and dry.     Findings: No erythema or rash.  Neurological:     General: No focal deficit present.  Mental Status: She is alert and oriented to person, place, and time.  Psychiatric:         Mood and Affect: Mood normal.        Behavior: Behavior normal.    Visual Acuity Right Eye Distance:   Left Eye Distance:   Bilateral Distance:    Right Eye Near:   Left Eye Near:    Bilateral Near:     UC Couse / Diagnostics / Procedures:    EKG  Radiology No results found.  Procedures Procedures (including critical care time)  UC Diagnoses / Final Clinical Impressions(s)   I have reviewed the triage vital signs and the nursing notes.  Pertinent labs & imaging results that were available during my care of the patient were reviewed by me and considered in my medical decision making (see chart for details).    Final diagnoses:  Acute midline thoracic back pain  Acute midline low back pain without sciatica   [facility], we will contact her with results once received and provide further recommendations if any.  ED Prescriptions   None    PDMP not reviewed this encounter.  Pending results:  Labs Reviewed - No data to display  Medications Ordered in UC: Medications - No data to display  Disposition Upon Discharge:  Condition: stable for discharge home Home: take medications as prescribed; routine discharge instructions as discussed; follow up as advised.  Patient presented with an acute illness with associated systemic symptoms and significant discomfort requiring urgent management. In my opinion, this is a condition that a prudent lay person (someone who possesses an average knowledge of health and medicine) may potentially expect to result in complications if not addressed urgently such as respiratory distress, impairment of bodily function or dysfunction of bodily organs.   Routine symptom specific, illness specific and/or disease specific instructions were discussed with the patient and/or caregiver at length.   As such, the patient has been evaluated and assessed, work-up was performed and treatment was provided in  alignment with urgent care protocols and evidence based medicine.  Patient/parent/caregiver has been advised that the patient may require follow up for further testing and treatment if the symptoms continue in spite of treatment, as clinically indicated and appropriate.  If the patient was tested for COVID-19, Influenza and/or RSV, then the patient/parent/guardian was advised to isolate at home pending the results of his/her diagnostic coronavirus test and potentially longer if theyre positive. I have also advised pt that if his/her COVID-19 test returns positive, it's recommended to self-isolate for at least 10 days after symptoms first appeared AND until fever-free for 24 hours without fever reducer AND other symptoms have improved or resolved. Discussed self-isolation recommendations as well as instructions for household member/close contacts as per the [facility] and [place] [facility], and also gave patient the COVID packet with this information.  Patient/parent/caregiver has been [facility] 3-5 days if no better; to PCP or the Emergency Department if new signs and symptoms develop, or if the current signs or symptoms continue to change or worsen for further workup, evaluation and treatment as clinically indicated and appropriate  The patient will follow up with their current PCP if and as advised. If the patient does not currently have a PCP we will assist them in obtaining one.   The patient may need specialty follow up if the symptoms continue, in spite of conservative treatment and management, for  further workup, evaluation, consultation and treatment as clinically indicated and appropriate.   Patient/parent/caregiver verbalized understanding and agreement of plan as discussed.  All questions were addressed during visit.  Please see discharge instructions below for further details of plan.  Discharge Instructions:   Discharge Instructions      Please go to [facility] to have x-ray done of your mid and lower back.  This x-ray has been ordered for you, please let them know that you were seen in urgent care.  You will not need to be admitted as a patient to that facility.  Once we receive the results of your x-ray, we will contact you and let you know what is recommended based on the results of your x-ray.  I am very sorry that you have not had any improvement of your pain and discomfort with our initial therapy.  It is my sincere hope that we get you feeling better soon.    This office note has been dictated using Teaching laboratory technician.  Unfortunately, and despite my best efforts, this method of dictation can sometimes lead to occasional typographical or grammatical errors.  I apologize in advance if this occurs.     Theadora Rama Scales, PA-C 01/25/22 1201

## 2022-01-25 NOTE — ED Triage Notes (Signed)
Pt mid back pain that has not gotten better since last visit.

## 2022-01-25 NOTE — Telephone Encounter (Signed)
PDMP reviewed, patient has minimal controlled substance prescription in the last 2 years, most recently August 2022 where she received 10 capsules.  Patient reports worsening pain in lumbar spine after attending a trampoline park, x-ray demonstrated mild facet arthropathy which I believe is exacerbated.  Patient not responding well to ketorolac and tapering dose of methylprednisolone.  Patient states baclofen is helping "a little bit".  Patient advised I can send her 3 days of hydrocodone, 2 tablets every 4 hours as needed.  Patient verbalized understanding that this prescription will not be renewed and that she will need to follow-up with either her primary care provider or go to an orthopedic urgent care clinic if her pain still is not improved after narcotic pain medication, rest continued anti-inflammatories and muscle relaxers.  Patient verbalized understanding.

## 2022-01-25 NOTE — Discharge Instructions (Addendum)
Please go to [facility] to have x-ray done of your mid and lower back.  This x-ray has been ordered for you, please let them know that you were seen in urgent care.  You will not need to be admitted as a patient to that facility.  Once we receive the results of your x-ray, we will contact you and let you know what is recommended based on the results of your x-ray.  I am very sorry that you have not had any improvement of your pain and discomfort with our initial therapy.  It is my sincere hope that we get you feeling better soon.

## 2023-06-19 ENCOUNTER — Ambulatory Visit (HOSPITAL_COMMUNITY)
Admission: EM | Admit: 2023-06-19 | Discharge: 2023-06-19 | Disposition: A | Discharge status: Home / Self Care | Payer: Medicare Other | Attending: Family Medicine | Admitting: Family Medicine

## 2023-06-19 ENCOUNTER — Encounter (HOSPITAL_COMMUNITY): Payer: Self-pay

## 2023-06-19 DIAGNOSIS — S61312A Laceration without foreign body of right middle finger with damage to nail, initial encounter: Secondary | ICD-10-CM

## 2023-06-19 DIAGNOSIS — Z23 Encounter for immunization: Secondary | ICD-10-CM | POA: Diagnosis not present

## 2023-06-19 MED ORDER — TETANUS-DIPHTH-ACELL PERTUSSIS 5-2.5-18.5 LF-MCG/0.5 IM SUSY
0.5000 mL | PREFILLED_SYRINGE | Freq: Once | INTRAMUSCULAR | Status: AC
  Administered 2023-06-19: 0.5 mL via INTRAMUSCULAR

## 2023-06-19 MED ORDER — TETANUS-DIPHTH-ACELL PERTUSSIS 5-2.5-18.5 LF-MCG/0.5 IM SUSY
PREFILLED_SYRINGE | INTRAMUSCULAR | Status: AC
  Filled 2023-06-19: qty 0.5

## 2023-06-19 NOTE — ED Notes (Signed)
Pt brought to triage room to cover finger laceration. She was using a mandoline and sliced off the top of her right pointer finger

## 2023-06-19 NOTE — ED Triage Notes (Signed)
Pt states she cut her right middle finger today on a mandolin. Bleeding controlled with dressing applied at triage upon patients arrival.

## 2023-06-19 NOTE — ED Provider Notes (Signed)
  [facility]   [id number] 06/19/23 Arrival Time: 1707  ASSESSMENT & PLAN:  1. Laceration of right middle finger without foreign body with damage to nail, initial encounter     Meds ordered this encounter  Medications   Tdap (BOOSTRIX) injection 0.5 mL   Nonadherent pressure dressing; to remove in the am. See AVS for d/c instructions.  Reviewed expectations re: course of current medical issues. Questions answered. Outlined signs and symptoms indicating need for more acute intervention. Patient verbalized understanding. After Visit Summary given.   SUBJECTIVE:  Christine Kaiser is a 32 y.o. female who states she cut her right middle finger today on a mandolin. Bleeding controlled with dressing applied at triage upon patients arrival.  Td not UTD.   OBJECTIVE:  Vitals:   06/19/23 1816  BP: 100/68  Pulse: 69  Resp: 16  Temp: 98.2 F (36.8 C)  TempSrc: Oral  SpO2: 98%     General appearance: alert; no distress Skin: skin avulsion of tip of R 3rd finger; small piece of distal nail involved; some bleeding; intact sensation  Psychological: alert and cooperative; normal mood and affect  Allergies  Allergen Reactions   Buspar [Buspirone] Hives   Coconut (Cocos Nucifera)     Past Medical History:  Diagnosis Date   Anxiety and depression    Cystocele, unspecified (CODE)    Social History   Socioeconomic History   Marital status: Single    Spouse name: Not on file   Number of children: Not on file   Years of education: Not on file   Highest education level: Not on file  Occupational History   Not on file  Tobacco Use   Smoking status: Every Day    Current packs/day: 0.50    Types: Cigarettes   Smokeless tobacco: Never   Tobacco comments:    1 pack every 3 days   Vaping Use   Vaping status: Never Used  Substance and Sexual Activity   Alcohol use: Not Currently   Drug use: Not Currently    Types: Marijuana   Sexual activity: Yes    Birth  control/protection: None  Other Topics Concern   Not on file  Social History Narrative   Not on file   Social Determinants of Health   Financial Resource Strain: Not on file  Food Insecurity: Not on file  Transportation Needs: Not on file  Physical Activity: Not on file  Stress: Not on file  Social Connections: Not on file          Mardella Layman, MD 06/19/23 [phone]

## 2023-06-19 NOTE — Discharge Instructions (Addendum)
Meds ordered this encounter  Medications   Tdap (BOOSTRIX) injection 0.5 mL    

## 2023-11-21 ENCOUNTER — Emergency Department (HOSPITAL_BASED_OUTPATIENT_CLINIC_OR_DEPARTMENT_OTHER)
Admission: EM | Admit: 2023-11-21 | Discharge: 2023-11-21 | Disposition: A | Discharge status: Home / Self Care | Payer: Medicare Other | Attending: Emergency Medicine | Admitting: Emergency Medicine

## 2023-11-21 ENCOUNTER — Other Ambulatory Visit: Payer: Self-pay

## 2023-11-21 ENCOUNTER — Encounter (HOSPITAL_BASED_OUTPATIENT_CLINIC_OR_DEPARTMENT_OTHER): Payer: Self-pay

## 2023-11-21 DIAGNOSIS — H6691 Otitis media, unspecified, right ear: Secondary | ICD-10-CM | POA: Insufficient documentation

## 2023-11-21 DIAGNOSIS — H9201 Otalgia, right ear: Secondary | ICD-10-CM | POA: Diagnosis present

## 2023-11-21 DIAGNOSIS — H6690 Otitis media, unspecified, unspecified ear: Secondary | ICD-10-CM

## 2023-11-21 MED ORDER — HYDROCODONE-ACETAMINOPHEN 5-325 MG PO TABS: 1.0000 | ORAL_TABLET | ORAL | 0 refills | Status: DC | PRN

## 2023-11-21 MED ORDER — ALBUTEROL SULFATE HFA 108 (90 BASE) MCG/ACT IN AERS
1.0000 | INHALATION_SPRAY | Freq: Once | RESPIRATORY_TRACT | Status: AC
  Administered 2023-11-21: 2 via RESPIRATORY_TRACT
  Filled 2023-11-21: qty 6.7

## 2023-11-21 MED ORDER — AMOXICILLIN 500 MG PO CAPS
500.0000 mg | ORAL_CAPSULE | Freq: Once | ORAL | Status: AC
  Administered 2023-11-21: 500 mg via ORAL
  Filled 2023-11-21: qty 1

## 2023-11-21 MED ORDER — AMOXICILLIN 500 MG PO CAPS: 500.0000 mg | ORAL_CAPSULE | Freq: Three times a day (TID) | ORAL | 0 refills | Status: DC

## 2023-11-21 MED ORDER — IBUPROFEN 800 MG PO TABS
800.0000 mg | ORAL_TABLET | Freq: Once | ORAL | Status: AC
  Administered 2023-11-21: 800 mg via ORAL
  Filled 2023-11-21: qty 1

## 2023-11-21 MED ORDER — IBUPROFEN 600 MG PO TABS: 600.0000 mg | ORAL_TABLET | Freq: Four times a day (QID) | ORAL | 0 refills | Status: DC | PRN

## 2023-11-21 MED ORDER — HYDROCODONE-ACETAMINOPHEN 5-325 MG PO TABS
1.0000 | ORAL_TABLET | Freq: Once | ORAL | Status: AC
  Administered 2023-11-21: 1 via ORAL
  Filled 2023-11-21: qty 1

## 2023-11-21 MED ORDER — AEROCHAMBER PLUS FLO-VU MEDIUM MISC
1.0000 | Freq: Once | Status: DC
  Filled 2023-11-21: qty 1

## 2023-11-21 NOTE — ED Triage Notes (Signed)
2 days ago pt realized hearing to right ear had decreased, ;ast night pt started having 10/10 pain to right ear. Has not taken OTC MEDS. No drainage from ear

## 2023-11-21 NOTE — ED Provider Notes (Signed)
[place] EMERGENCY DEPARTMENT AT [facility] Provider Note   CSN: [id number] Arrival date & time: 11/21/23  4098     History  Chief Complaint  Patient presents with   Otalgia    Right ear    Christine Kaiser is a 32 y.o. female.  Pt is a 32 yo female with pmhx significant for anxiety and tobacco abuse.  Pt said she's getting over a cold and now has right ear pain.  She now has decreased hearing in that ear.  She has not taken anything for sx.       Home Medications Prior to Admission medications   Medication Sig Start Date End Date Taking? Authorizing Provider  amoxicillin (AMOXIL) 500 MG capsule Take 1 capsule (500 mg total) by mouth 3 (three) times daily. 11/21/23  Yes Jacalyn Lefevre, MD  HYDROcodone-acetaminophen (NORCO/VICODIN) 5-325 MG tablet Take 1 tablet by mouth every 4 (four) hours as needed. 11/21/23  Yes Jacalyn Lefevre, MD  ibuprofen (ADVIL) 600 MG tablet Take 1 tablet (600 mg total) by mouth every 6 (six) hours as needed. 11/21/23  Yes Jacalyn Lefevre, MD  escitalopram (LEXAPRO) 20 MG tablet Take 1 tablet (20 mg total) by mouth daily. 05/30/21   Sabino Dick, DO      Allergies    Buspar [buspirone] and Coconut (cocos nucifera)    Review of Systems   Review of Systems  HENT:  Positive for ear pain and rhinorrhea.   Respiratory:  Positive for cough.     Physical Exam Updated Vital Signs BP 127/83   Pulse 74   Temp 98.8 F (37.1 C) (Oral)   Resp 18   Ht 4\' 11"  (1.499 m)   Wt 71.2 kg   LMP 11/10/2023   SpO2 95%   BMI 31.71 kg/m  Physical Exam Vitals and nursing note reviewed.  Constitutional:      Appearance: Normal appearance.  HENT:     Head: Normocephalic and atraumatic.     Right Ear: External ear normal. Tympanic membrane is injected and erythematous.     Left Ear: External ear normal.     Nose: Rhinorrhea present.     Mouth/Throat:     Mouth: Mucous membranes are moist.     Pharynx: Oropharynx is clear.  Eyes:      Extraocular Movements: Extraocular movements intact.     Conjunctiva/sclera: Conjunctivae normal.     Pupils: Pupils are equal, round, and reactive to light.  Cardiovascular:     Rate and Rhythm: Normal rate and regular rhythm.     Pulses: Normal pulses.     Heart sounds: Normal heart sounds.  Pulmonary:     Breath sounds: Normal breath sounds.  Abdominal:     General: Abdomen is flat. Bowel sounds are normal.     Palpations: Abdomen is soft.  Musculoskeletal:        General: Normal range of motion.     Cervical back: Normal range of motion and neck supple.  Skin:    General: Skin is warm.     Capillary Refill: Capillary refill takes less than 2 seconds.  Neurological:     General: No focal deficit present.     Mental Status: She is alert and oriented to person, place, and time.  Psychiatric:        Mood and Affect: Mood normal.        Behavior: Behavior normal.     ED Results / Procedures / Treatments   Labs (all labs ordered  are listed, but only abnormal results are displayed) Labs Reviewed - No data to display  EKG None  Radiology No results found.  Procedures Procedures    Medications Ordered in ED Medications  amoxicillin (AMOXIL) capsule 500 mg (has no administration in time range)  albuterol (VENTOLIN HFA) 108 (90 Base) MCG/ACT inhaler 1-2 puff (has no administration in time range)  AeroChamber Plus Flo-Vu Medium MISC 1 each (has no administration in time range)  ibuprofen (ADVIL) tablet 800 mg (has no administration in time range)  HYDROcodone-acetaminophen (NORCO/VICODIN) 5-325 MG per tablet 1 tablet (has no administration in time range)    ED Course/ Medical Decision Making/ A&P                                 Medical Decision Making Risk Prescription drug management.   This patient presents to the ED for concern of ear pain, this involves an extensive number of treatment options, and is a complaint that carries with it a high risk of  complications and morbidity.  The differential diagnosis includes om, oe, uri   Co morbidities that complicate the patient evaluation  Anxiety and tobacco abuse   Additional history obtained:  Additional history obtained from epic chart review External records from outside source obtained and reviewed including sig other  Medicines ordered and prescription drug management:  I ordered medication including amox, ibuprofen, lortab  for sx  Reevaluation of the patient after these medicines showed that the patient improved I have reviewed the patients home medicines and have made adjustments as needed  Problem List / ED Course:  OM:  pt started on amox, ibuprofen, and lortab.  She is encouraged to stop smoking. Wheezing:  pt given inhaler.  Pt is to return if worse.  F/u with pcp.   Reevaluation:  After the interventions noted above, I reevaluated the patient and found that they have :improved   Social Determinants of Health:  Lives at home   Dispostion:  After consideration of the diagnostic results and the patients response to treatment, I feel that the patent would benefit from discharge with outpatient f/u.          Final Clinical Impression(s) / ED Diagnoses Final diagnoses:  Acute otitis media, unspecified otitis media type    Rx / DC Orders ED Discharge Orders          Ordered    ibuprofen (ADVIL) 600 MG tablet  Every 6 hours PRN        11/21/23 0715    HYDROcodone-acetaminophen (NORCO/VICODIN) 5-325 MG tablet  Every 4 hours PRN        11/21/23 0715    amoxicillin (AMOXIL) 500 MG capsule  3 times daily        11/21/23 0715              Jacalyn Lefevre, MD 11/21/23 0725

## 2023-11-21 NOTE — Discharge Instructions (Signed)
Try to stop smoking. °

## 2024-10-06 ENCOUNTER — Encounter: Payer: Self-pay | Admitting: *Deleted

## 2024-10-06 ENCOUNTER — Other Ambulatory Visit: Payer: Self-pay

## 2024-10-06 ENCOUNTER — Ambulatory Visit (INDEPENDENT_AMBULATORY_CARE_PROVIDER_SITE_OTHER): Admitting: Obstetrics and Gynecology

## 2024-10-06 VITALS — BP 114/58 | HR 76 | Ht 59.0 in | Wt 196.4 lb

## 2024-10-06 DIAGNOSIS — F419 Anxiety disorder, unspecified: Secondary | ICD-10-CM

## 2024-10-06 DIAGNOSIS — Z3201 Encounter for pregnancy test, result positive: Secondary | ICD-10-CM

## 2024-10-06 DIAGNOSIS — Z3200 Encounter for pregnancy test, result unknown: Secondary | ICD-10-CM

## 2024-10-06 LAB — POCT PREGNANCY, URINE: Preg Test, Ur: POSITIVE — AB

## 2024-10-06 MED ORDER — HYDROXYZINE HCL 25 MG PO TABS: 25.0000 mg | ORAL_TABLET | Freq: Four times a day (QID) | ORAL | 2 refills | Status: AC | PRN

## 2024-10-06 NOTE — Progress Notes (Signed)
 Pt presents for pregnancy test which is positive. She reports sure LMP 08/26/24 which yields EDD 06/02/25, now [redacted]w[redacted]d. [id number]. She denies having vaginal bleeding or abdominal pain. She was advised to go to [facility] if she develops these symptoms. Pt instructed to begin taking prenatal vitamins. Pt had previous prenatal care @ [facility] and will schedule appts there. She voiced understanding of all information and instructions given.

## 2024-10-10 NOTE — Progress Notes (Signed)
  History:  Ms. Christine Kaiser is a 33 y.o. [id number] who presents to clinic today for pregnancy confirmation. Reports LMP 08/26/24. She is a [id number].  Hx of anxiety   Past Medical History:  Diagnosis Date   Anxiety and depression    Cystocele, unspecified (CODE)     Past Surgical History:  Procedure Laterality Date   WISDOM TOOTH EXTRACTION      The following portions of the patient's history were reviewed and updated as appropriate: allergies, current medications, past family history, past medical history, past social history, past surgical history and problem list.   Review of Systems:  Pertinent items noted in HPI and remainder of comprehensive ROS otherwise negative.  Objective:  Physical Exam BP (!) 114/58   Pulse 76   Ht 4' 11 (1.499 m)   Wt 196 lb 6.4 oz (89.1 kg)   LMP 08/26/2024 (Exact Date)   BMI 39.67 kg/m  Physical Exam Vitals and nursing note reviewed.  Constitutional:      Appearance: Normal appearance.  Pulmonary:     Effort: Pulmonary effort is normal.  Neurological:     General: No focal deficit present.     Mental Status: She is alert.  Psychiatric:        Mood and Affect: Mood normal.        Behavior: Behavior normal.    Labs and Imaging No results found for this or any previous visit (from the past 24 hours).   Assessment & Plan:  1. Possible pregnancy (Primary) Congratulated ! Nurse provided information on hospital and precautions Plans to call to establish care with [facility]  2. Anxiety Discussed trial hydroxyzine  as needed  - hydrOXYzine  (ATARAX ) 25 MG tablet; Take 1 tablet (25 mg total) by mouth every 6 (six) hours as needed for itching.  Dispense: 30 tablet; Refill: 2   Approximately 15 minutes of total time was spent with this patient on history taking, coordination of care, education and documentation.   Delores Nidia CROME, FNP

## 2024-10-12 ENCOUNTER — Ambulatory Visit (HOSPITAL_COMMUNITY)
Admission: EM | Admit: 2024-10-12 | Discharge: 2024-10-12 | Disposition: A | Discharge status: Home / Self Care | Attending: Family Medicine | Admitting: Family Medicine

## 2024-10-12 ENCOUNTER — Other Ambulatory Visit: Payer: Self-pay

## 2024-10-12 ENCOUNTER — Encounter (HOSPITAL_COMMUNITY): Payer: Self-pay | Admitting: *Deleted

## 2024-10-12 DIAGNOSIS — H66001 Acute suppurative otitis media without spontaneous rupture of ear drum, right ear: Secondary | ICD-10-CM

## 2024-10-12 MED ORDER — AMOXICILLIN 875 MG PO TABS: 875.0000 mg | ORAL_TABLET | Freq: Two times a day (BID) | ORAL | 0 refills | Status: AC

## 2024-10-12 NOTE — ED Triage Notes (Signed)
 PT has had RT ear pain for 4 days. Pt has tried heating pad and steam with out relief of pain.

## 2024-10-12 NOTE — ED Provider Notes (Signed)
 [facility]   [id number] 10/12/24 Arrival Time: 1434  ASSESSMENT & PLAN:  1. Non-recurrent acute suppurative otitis media of right ear without spontaneous rupture of tympanic membrane    Begin: Meds ordered this encounter  Medications   amoxicillin  (AMOXIL ) 875 MG tablet    Sig: Take 1 tablet (875 mg total) by mouth 2 (two) times daily for 10 days.    Dispense:  20 tablet    Refill:  0     Follow-up Information     Diona Perkins, MD.   Specialty: Family Medicine Why: As needed. Contact information: [address] [place] [address] [phone]                 Reviewed expectations re: course of current medical issues. Questions answered. Outlined signs and symptoms indicating need for more acute intervention. Understanding verbalized. After Visit Summary given.   SUBJECTIVE: History from: Patient. Christine Kaiser is a 33 y.o. female. PT has had RT ear pain for 4 days. Pt has tried heating pad and steam with out relief of pain. Denies: fever. Normal PO intake without n/v/d.  OBJECTIVE:  Vitals:   10/12/24 1606  BP: 112/67  Pulse: 77  Resp: 18  Temp: 98.1 F (36.7 C)  SpO2: 97%    General appearance: alert; no distress Eyes: PERRLA; EOMI; conjunctiva normal HENT: [place]; AT; with nasal congestion; R TM with erythema/bulging Neck: supple  Psychological: alert and cooperative; normal mood and affect  Labs: Results for orders placed or performed in visit on 10/06/24  Pregnancy, urine POC   Collection Time: 10/06/24  8:43 AM  Result Value Ref Range   Preg Test, Ur POSITIVE (A) NEGATIVE   Labs Reviewed - No data to display  Imaging: No results found.  Allergies  Allergen Reactions   Coconut (Cocos Nucifera)     Past Medical History:  Diagnosis Date   Anxiety and depression    Cystocele, unspecified (CODE)    Social History   Socioeconomic History   Marital status: Single    Spouse name: Not on file   Number of children:  Not on file   Years of education: Not on file   Highest education level: Not on file  Occupational History   Not on file  Tobacco Use   Smoking status: Every Day    Current packs/day: 0.50    Types: Cigarettes   Smokeless tobacco: Never   Tobacco comments:    1 pack every 3 days   Vaping Use   Vaping status: Never Used  Substance and Sexual Activity   Alcohol use: Not Currently   Drug use: Not Currently    Types: Marijuana   Sexual activity: Yes    Birth control/protection: None  Other Topics Concern   Not on file  Social History Narrative   Not on file   Social Drivers of Health   Financial Resource Strain: Not on file  Food Insecurity: Not on file  Transportation Needs: Not on file  Physical Activity: Not on file  Stress: Not on file  Social Connections: Not on file  Intimate Partner Violence: Not on file   Family History  Problem Relation Age of Onset   Colon cancer Mother    Sleep apnea Father    Diabetes Father    Hyperlipidemia Maternal Grandmother    Diabetes Maternal Grandmother    Colon cancer Maternal Grandfather    Colon cancer Maternal Aunt    Past Surgical History:  Procedure Laterality  Date   WISDOM TOOTH EXTRACTION       Rolinda Rogue, MD 10/12/24 1630

## 2024-10-27 ENCOUNTER — Ambulatory Visit: Admitting: *Deleted

## 2024-10-27 ENCOUNTER — Other Ambulatory Visit: Payer: Self-pay

## 2024-10-27 VITALS — BP 127/72 | HR 77 | Wt 201.8 lb

## 2024-10-27 DIAGNOSIS — O3680X0 Pregnancy with inconclusive fetal viability, not applicable or unspecified: Secondary | ICD-10-CM

## 2024-10-27 DIAGNOSIS — Z23 Encounter for immunization: Secondary | ICD-10-CM

## 2024-10-27 DIAGNOSIS — Z3480 Encounter for supervision of other normal pregnancy, unspecified trimester: Secondary | ICD-10-CM | POA: Insufficient documentation

## 2024-10-27 MED ORDER — VITAFOL GUMMIES 3.33-0.333-34.8 MG PO CHEW: 1.0000 | CHEWABLE_TABLET | Freq: Every day | ORAL | 5 refills | Status: AC

## 2024-10-27 MED ORDER — BLOOD PRESSURE KIT DEVI: 1.0000 | 0 refills | Status: AC

## 2024-10-27 NOTE — Progress Notes (Signed)
 New OB Intake  I connected with Christine Kaiser  on 10/27/24 at  3:10 PM EST by In Person Visit and verified that I am speaking with the correct person using two identifiers. Nurse is located at [facility] and pt is located at [facility].  I discussed the limitations, risks, security and privacy concerns of performing an evaluation and management service by telephone and the availability of in person appointments. I also discussed with the patient that there may be a patient responsible charge related to this service. The patient expressed understanding and agreed to proceed.  I explained I am completing New OB Intake today. We discussed EDD of 06/02/25 based on LMP of 08/26/25. Pt is [id number]. I reviewed her allergies, medications and Medical/Surgical/OB history.    Patient Active Problem List   Diagnosis Date Noted   Supervision of other normal pregnancy, antepartum 10/27/2024   Urticaria 05/30/2021   Anxiety and depression 05/18/2021   Family history of FAP (familial adenomatous polyposis) 05/18/2021   Assistance needed with transportation 05/18/2021   Anxiety 05/07/2021   Family history of colon cancer 05/07/2021   History of gestational diabetes 12/23/2019   Cystocele with prolapse 08/31/2019   Tobacco smoking affecting pregnancy, antepartum 08/17/2019     Concerns addressed today  Delivery Plans Plans to deliver at [facility]. Discussed the nature of our practice with multiple providers including residents and students as well as female and female providers. Due to the size of the practice, the delivering provider may not be the same as those providing prenatal care.   Patient is not interested in water birth.  MyChart/Babyscripts MyChart access verified. I explained pt will have some visits in office and some virtually. Babyscripts instructions given and order placed. Patient verifies receipt of registration text/e-mail. Account successfully created and app downloaded. If patient is a  candidate for Optimized scheduling, add to sticky note.   Blood Pressure Cuff/Weight Scale Blood pressure cuff ordered for patient to pick-up from [employer]. Explained after first prenatal appt pt will check weekly and document in Babyscripts. Patient does not have weight scale; patient may purchase if they desire to track weight weekly in Babyscripts.  Anatomy US  Explained first scheduled US  will be around 19 weeks. Anatomy US  scheduled for TBD at TBD.  Is patient a candidate for Babyscripts Optimization? No, due to Risk Factors   First visit review I reviewed new OB appt with patient. Explained pt will be seen by Christine Dauer, NP at first visit. Discussed Christine Kaiser genetic screening with patient. Requests Panorama and Horizon.. Routine prenatal labs OB Urine collected at today's visit. Initial OB labs deferred to New OB appt.   Last Pap Diagnosis  Date Value Ref Range Status  08/31/2019   Final   - Negative for intraepithelial lesion or malignancy (NILM)    Christine CHRISTELLA Ober, RN 10/27/2024  3:38 PM

## 2024-10-27 NOTE — Patient Instructions (Signed)
[facility] has a partnership with the [employer] to provide prenatal navigation for the most needed resources in our community. In order to see how we can help connect you to these resources we need consent to contact you. Please complete the very short consent using the link below:   English Link: [email or web address]  Spanish Link: [email or web address]  

## 2024-11-03 ENCOUNTER — Encounter (HOSPITAL_COMMUNITY): Payer: Self-pay | Admitting: Emergency Medicine

## 2024-11-03 ENCOUNTER — Ambulatory Visit (HOSPITAL_COMMUNITY): Admission: EM | Admit: 2024-11-03 | Discharge: 2024-11-03 | Disposition: A | Discharge status: Home / Self Care

## 2024-11-03 DIAGNOSIS — J069 Acute upper respiratory infection, unspecified: Secondary | ICD-10-CM

## 2024-11-03 LAB — POC COVID19/FLU A&B COMBO
Covid Antigen, POC: NEGATIVE
Influenza A Antigen, POC: NEGATIVE
Influenza B Antigen, POC: NEGATIVE

## 2024-11-03 MED ORDER — AZELASTINE HCL 0.1 % NA SOLN: 1.0000 | Freq: Two times a day (BID) | NASAL | 0 refills | Status: DC

## 2024-11-03 MED ORDER — AZITHROMYCIN 250 MG PO TABS: 250.0000 mg | ORAL_TABLET | Freq: Every day | ORAL | 0 refills | Status: AC

## 2024-11-03 MED ORDER — ALBUTEROL SULFATE HFA 108 (90 BASE) MCG/ACT IN AERS: 1.0000 | INHALATION_SPRAY | Freq: Four times a day (QID) | RESPIRATORY_TRACT | 0 refills | Status: AC | PRN

## 2024-11-03 NOTE — ED Provider Notes (Signed)
 [facility]  Note:  This document was prepared using Conservation officer, historic buildings and may include unintentional dictation errors.  MRN: [id number] DOB: 1991/07/05  Subjective:   Christine Kaiser is a 33 y.o. female presenting for persistent cough x 2 to 3 days.  Patient reports that cough is worse at night when she lays down, occasionally coughs up phlegm.  Patient concerned that infection may be settling into the chest.  Has been drinking teas and using cough drops with minimal improvement to symptoms.  Patient reports that she is currently [redacted] weeks pregnant and is unable to take a lot of the over-the-counter medication to treat cough and cold symptoms.  No shortness of breath, chest pain, weakness, dizziness at this time.  No current facility-administered medications for this encounter.  Current Outpatient Medications:    albuterol  (VENTOLIN  HFA) 108 (90 Base) MCG/ACT inhaler, Inhale 1-2 puffs into the lungs every 6 (six) hours as needed for wheezing or shortness of breath., Disp: 6.7 g, Rfl: 0   azelastine  (ASTELIN ) 0.1 % nasal spray, Place 1 spray into both nostrils 2 (two) times daily. Use in each nostril as directed, Disp: 30 mL, Rfl: 0   azithromycin  (ZITHROMAX ) 250 MG tablet, Take 1 tablet (250 mg total) by mouth daily. Take first 2 tablets together, then 1 every day until finished., Disp: 6 tablet, Rfl: 0   Blood Pressure Monitoring (BLOOD PRESSURE KIT) DEVI, 1 Device by Does not apply route once a week., Disp: 1 each, Rfl: 0   hydrOXYzine  (ATARAX ) 25 MG tablet, Take 1 tablet (25 mg total) by mouth every 6 (six) hours as needed for itching., Disp: 30 tablet, Rfl: 2   Prenatal Vit-Fe Phos-FA-Omega (VITAFOL  GUMMIES) 3.33-0.333-34.8 MG CHEW, Chew 1 tablet by mouth daily., Disp: 90 tablet, Rfl: 5   Allergies  Allergen Reactions   Coconut (Cocos Nucifera)     Past Medical History:  Diagnosis Date   Anxiety and depression    Cystocele, unspecified (CODE)       Past Surgical History:  Procedure Laterality Date   WISDOM TOOTH EXTRACTION      Family History  Problem Relation Age of Onset   Colon cancer Mother    Sleep apnea Father    Diabetes Father    Hyperlipidemia Maternal Grandmother    Diabetes Maternal Grandmother    Colon cancer Maternal Grandfather    Colon cancer Maternal Aunt     Social History   Tobacco Use   Smoking status: Every Day    Current packs/day: 0.25    Types: Cigarettes   Smokeless tobacco: Never   Tobacco comments:    1 pack every 3 days   Vaping Use   Vaping status: Never Used  Substance Use Topics   Alcohol use: Not Currently   Drug use: Yes    Types: Marijuana    ROS Refer to HPI for ROS details.  Objective:    Vitals: BP 104/75 (BP Location: Left Arm)   Pulse 88   Temp 98.6 F (37 C) (Oral)   Resp 18   LMP 08/26/2024 (Exact Date)   SpO2 96%   Physical Exam Vitals and nursing note reviewed.  Constitutional:      General: She is not in acute distress.    Appearance: Normal appearance. She is well-developed. She is not ill-appearing or toxic-appearing.  HENT:     Head: Normocephalic and atraumatic.     Nose: Nose normal.     Mouth/Throat:     Mouth:  Mucous membranes are moist.     Pharynx: Oropharynx is clear.  Cardiovascular:     Rate and Rhythm: Normal rate and regular rhythm.     Heart sounds: Normal heart sounds. No murmur heard. Pulmonary:     Effort: Pulmonary effort is normal. No respiratory distress.     Breath sounds: Normal breath sounds. No stridor. No wheezing, rhonchi or rales.  Chest:     Chest wall: No tenderness.  Abdominal:     Palpations: Abdomen is soft.     Tenderness: There is no abdominal tenderness. There is no right CVA tenderness or left CVA tenderness.  Skin:    General: Skin is warm and dry.  Neurological:     General: No focal deficit present.     Mental Status: She is alert and oriented to person, place, and time.  Psychiatric:        Mood and  Affect: Mood normal.        Behavior: Behavior normal.     Procedures  Results for orders placed or performed during the hospital encounter of 11/03/24 (from the past 24 hours)  POC Covid19/Flu A&B Antigen     Status: None   Collection Time: 11/03/24  9:41 AM  Result Value Ref Range   Influenza A Antigen, POC Negative Negative   Influenza B Antigen, POC Negative Negative   Covid Antigen, POC Negative Negative    Assessment and Plan :     Discharge Instructions       1. Viral URI with cough (Primary) - POC Covid19/Flu A&B Antigen swab completed today in UC is negative for COVID and influenza. - azelastine (ASTELIN) 0.1 % nasal spray; Place 1 spray into both nostrils 2 (two) times daily. Use in each nostril as directed  Dispense: 30 mL; Refill: 0 - albuterol  (VENTOLIN  HFA) 108 (90 Base) MCG/ACT inhaler; Inhale 1-2 puffs into the lungs every 6 (six) hours as needed for wheezing or shortness of breath.  Dispense: 6.7 g; Refill: 0 - azithromycin (ZITHROMAX) 250 MG tablet; Take 1 tablet (250 mg total) by mouth daily. Take first 2 tablets together, then 1 every day until finished.  Antibiotic prescribed to pharmacy for treatment of possible secondary infection if symptoms do not improve with conservative therapy.  -Continue to monitor symptoms for any change in severity if there is any escalation of current symptoms or development of new symptoms follow-up in ER for further evaluation and management.      Roshon Duell B Staton Markey   Marci Polito, [place] B, [place] 11/03/24 1038

## 2024-11-03 NOTE — Discharge Instructions (Addendum)
  1. Viral URI with cough (Primary) - POC Covid19/Flu A&B Antigen swab completed today in UC is negative for COVID and influenza. - azelastine  (ASTELIN ) 0.1 % nasal spray; Place 1 spray into both nostrils 2 (two) times daily. Use in each nostril as directed  Dispense: 30 mL; Refill: 0 - albuterol  (VENTOLIN  HFA) 108 (90 Base) MCG/ACT inhaler; Inhale 1-2 puffs into the lungs every 6 (six) hours as needed for wheezing or shortness of breath.  Dispense: 6.7 g; Refill: 0 - azithromycin  (ZITHROMAX ) 250 MG tablet; Take 1 tablet (250 mg total) by mouth daily. Take first 2 tablets together, then 1 every day until finished.  Antibiotic prescribed to pharmacy for treatment of possible secondary infection if symptoms do not improve with conservative therapy.  -Continue to monitor symptoms for any change in severity if there is any escalation of current symptoms or development of new symptoms follow-up in ER for further evaluation and management.

## 2024-11-03 NOTE — ED Triage Notes (Signed)
 Pt reports that she had a cough for several days. Reports does get up phlegm. Is [redacted] weeks pregnant so tried teas, and cough drops. Reports cough is worse at night esp when lays down.

## 2024-11-12 ENCOUNTER — Encounter (HOSPITAL_COMMUNITY): Payer: Self-pay

## 2024-11-12 ENCOUNTER — Ambulatory Visit (HOSPITAL_COMMUNITY): Admission: EM | Admit: 2024-11-12 | Discharge: 2024-11-12 | Disposition: A | Discharge status: Home / Self Care

## 2024-11-12 ENCOUNTER — Ambulatory Visit (HOSPITAL_COMMUNITY)

## 2024-11-12 DIAGNOSIS — G9331 Postviral fatigue syndrome: Secondary | ICD-10-CM | POA: Diagnosis not present

## 2024-11-12 DIAGNOSIS — J069 Acute upper respiratory infection, unspecified: Secondary | ICD-10-CM

## 2024-11-12 MED ORDER — AZELASTINE HCL 0.1 % NA SOLN: 1.0000 | Freq: Two times a day (BID) | NASAL | 0 refills | Status: AC

## 2024-11-12 NOTE — ED Triage Notes (Signed)
Pt is [redacted] weeks pregnant

## 2024-11-12 NOTE — Discharge Instructions (Addendum)
°  1. Postviral syndrome (Primary) - azelastine  (ASTELIN ) 0.1 % nasal spray; Place 1 spray into both nostrils 2 (two) times daily. Use in each nostril as directed  Dispense: 30 mL; Refill: 0 - Take vitamin C with zinc and vitamin D to support immune system. - Get plenty of rest and drink plenty of fluids. - Worsening cough at night when lying down usually caused by postnasal drip which can be remedied with nasal antihistamine such as azelastine  as prescribed.  -Continue to monitor symptoms for any change in severity if there is any escalation of current symptoms or development of new symptoms follow-up in ER for further evaluation and management.

## 2024-11-12 NOTE — ED Triage Notes (Signed)
 PT is here for a follow-up on her cough. States she is still coughing. She last use her inhaler 10 min ago.

## 2024-11-12 NOTE — ED Provider Notes (Signed)
 [facility] [place]  Note:  This document was prepared using Conservation officer, historic buildings and may include unintentional dictation errors.  MRN: [id number] DOB: [birth year]  Subjective:   Christine Kaiser is a 33 y.o. female presenting for follow-up evaluation for ongoing cough x 2 weeks.  Patient reports that she was here 2 weeks ago and was seen for similar symptoms and was prescribed nasal spray and azithromycin .  Patient reports that symptoms were better during taking the antibiotic but after finishing antibiotics symptoms came back.  Patient reports that she did not pick up the nasal spray as prescribed.  Has been using albuterol  inhaler with minimal improvement.  Patient denies any wheezing, chest pain, shortness of breath, weakness, dizziness, fever.  Patient is currently [redacted] weeks pregnant.  Current Medications[1]   Allergies[2]  Past Medical History:  Diagnosis Date   Anxiety and depression    Cystocele, unspecified (CODE)      Past Surgical History:  Procedure Laterality Date   WISDOM TOOTH EXTRACTION      Family History  Problem Relation Age of Onset   Colon cancer Mother    Sleep apnea Father    Diabetes Father    Hyperlipidemia Maternal Grandmother    Diabetes Maternal Grandmother    Colon cancer Maternal Grandfather    Colon cancer Maternal Aunt     Social History[3]  ROS Refer to HPI for ROS details.  Objective:    Vitals: BP (!) 144/85 (BP Location: Left Arm)   Pulse (!) 103   Temp 98.2 F (36.8 C) (Oral)   Resp 18   LMP 08/26/2024 (Exact Date)   SpO2 94%   Physical Exam Vitals and nursing note reviewed.  Constitutional:      General: She is not in acute distress.    Appearance: Normal appearance. She is well-developed. She is not ill-appearing or toxic-appearing.  HENT:     Head: Normocephalic and atraumatic.     Nose: Congestion present. No rhinorrhea.     Mouth/Throat:     Mouth: Mucous membranes are moist.     Pharynx:  Oropharynx is clear.  Cardiovascular:     Rate and Rhythm: Normal rate and regular rhythm.     Heart sounds: Normal heart sounds. No murmur heard. Pulmonary:     Effort: Pulmonary effort is normal. No respiratory distress.     Breath sounds: Normal breath sounds. No stridor. No wheezing, rhonchi or rales.  Chest:     Chest wall: No tenderness.  Skin:    General: Skin is warm and dry.  Neurological:     General: No focal deficit present.     Mental Status: She is alert and oriented to person, place, and time.  Psychiatric:        Mood and Affect: Mood normal.        Behavior: Behavior normal.     Procedures  No results found for this or any previous visit (from the past 24 hours).  Assessment and Plan :     Discharge Instructions       1. Postviral syndrome (Primary) - azelastine  (ASTELIN ) 0.1 % nasal spray; Place 1 spray into both nostrils 2 (two) times daily. Use in each nostril as directed  Dispense: 30 mL; Refill: 0 - Take vitamin C with zinc and vitamin D to support immune system. - Get plenty of rest and drink plenty of fluids. - Worsening cough at night when lying down usually caused by postnasal drip which can be remedied with  nasal antihistamine such as azelastine  as prescribed.  -Continue to monitor symptoms for any change in severity if there is any escalation of current symptoms or development of new symptoms follow-up in ER for further evaluation and management.      Maire Govan B [place] Gentle    [1] No current facility-administered medications for this encounter.  Current Outpatient Medications:    albuterol  (VENTOLIN  HFA) 108 (90 Base) MCG/ACT inhaler, Inhale 1-2 puffs into the lungs every 6 (six) hours as needed for wheezing or shortness of breath., Disp: 6.7 g, Rfl: 0   azithromycin  (ZITHROMAX ) 250 MG tablet, Take 1 tablet (250 mg total) by mouth daily. Take first 2 tablets together, then 1 every day until finished., Disp: 6 tablet, Rfl: 0   Blood Pressure  Monitoring (BLOOD PRESSURE KIT) DEVI, 1 Device by Does not apply route once a week., Disp: 1 each, Rfl: 0   Prenatal Vit-Fe Phos-FA-Omega (VITAFOL  GUMMIES) 3.33-0.333-34.8 MG CHEW, Chew 1 tablet by mouth daily., Disp: 90 tablet, Rfl: 5   azelastine  (ASTELIN ) 0.1 % nasal spray, Place 1 spray into both nostrils 2 (two) times daily. Use in each nostril as directed, Disp: 30 mL, Rfl: 0   hydrOXYzine  (ATARAX ) 25 MG tablet, Take 1 tablet (25 mg total) by mouth every 6 (six) hours as needed for itching., Disp: 30 tablet, Rfl: 2 [2]  Allergies Allergen Reactions   Coconut (Cocos Nucifera)   [3]  Social History Tobacco Use   Smoking status: Every Day    Current packs/day: 0.25    Types: Cigarettes   Smokeless tobacco: Never   Tobacco comments:    1 pack every 3 days   Vaping Use   Vaping status: Never Used  Substance Use Topics   Alcohol use: Not Currently   Drug use: Yes    Types: Marijuana     Aurea Goodell B, NP 11/12/24 1630

## 2024-12-01 ENCOUNTER — Ambulatory Visit: Admitting: Student

## 2024-12-01 ENCOUNTER — Other Ambulatory Visit (HOSPITAL_COMMUNITY)
Admission: RE | Admit: 2024-12-01 | Discharge: 2024-12-01 | Disposition: A | Discharge status: Home / Self Care | Source: Ambulatory Visit | Attending: Student | Admitting: Student

## 2024-12-01 VITALS — BP 119/77 | HR 92 | Wt 202.8 lb

## 2024-12-01 DIAGNOSIS — O99330 Smoking (tobacco) complicating pregnancy, unspecified trimester: Secondary | ICD-10-CM

## 2024-12-01 DIAGNOSIS — Z124 Encounter for screening for malignant neoplasm of cervix: Secondary | ICD-10-CM | POA: Diagnosis not present

## 2024-12-01 DIAGNOSIS — Z6841 Body Mass Index (BMI) 40.0 and over, adult: Secondary | ICD-10-CM

## 2024-12-01 DIAGNOSIS — Z8632 Personal history of gestational diabetes: Secondary | ICD-10-CM

## 2024-12-01 DIAGNOSIS — Z3A13 13 weeks gestation of pregnancy: Secondary | ICD-10-CM

## 2024-12-01 DIAGNOSIS — O99331 Smoking (tobacco) complicating pregnancy, first trimester: Secondary | ICD-10-CM

## 2024-12-01 DIAGNOSIS — Z1151 Encounter for screening for human papillomavirus (HPV): Secondary | ICD-10-CM | POA: Diagnosis not present

## 2024-12-01 DIAGNOSIS — Z3480 Encounter for supervision of other normal pregnancy, unspecified trimester: Secondary | ICD-10-CM

## 2024-12-01 DIAGNOSIS — Z87448 Personal history of other diseases of urinary system: Secondary | ICD-10-CM

## 2024-12-01 DIAGNOSIS — Z01419 Encounter for gynecological examination (general) (routine) without abnormal findings: Secondary | ICD-10-CM | POA: Diagnosis present

## 2024-12-01 MED ORDER — ASPIRIN 81 MG PO TBEC: 81.0000 mg | DELAYED_RELEASE_TABLET | Freq: Every day | ORAL | 2 refills | Status: AC

## 2024-12-01 NOTE — Progress Notes (Signed)
 Pt presents for new ob. Pt has no questions or concerns at this time. Pt screening was high... Referral to [facility]

## 2024-12-01 NOTE — Progress Notes (Signed)
 "  INITIAL PRENATAL VISIT  History:  Christine Kaiser is a 33 y.o. H5E7987 at [redacted]w[redacted]d by LMP being seen today for her first obstetrical visit.  Her obstetrical history is significant for obesity, smoker, and h/o GDM. Patient does not intend to breast feed. Pregnancy history fully reviewed.  Patient reports no complaints.  HISTORY: OB History  Gravida Para Term Preterm AB Living  4 2 2  0 1 2  SAB IAB Ectopic Multiple Live Births  1 0 0 0 2    # Outcome Date GA Lbr Len/2nd Weight Sex Type Anes PTL Lv  4 Current           3 Term 03/03/20 [redacted]w[redacted]d / 00:50 7 lb 2 oz (3.232 kg) F Vag-Spont EPI  LIV     Name: Goedecke,GIRL Dollene     Apgar1: 8  Apgar5: 9  2 Term 09/20/14 [redacted]w[redacted]d    Vag-Spont EPI  LIV  1 SAB             Last pap smear was done 2020 and was normal  Past Medical History:  Diagnosis Date   Anxiety and depression    Cystocele, unspecified (CODE)    Past Surgical History:  Procedure Laterality Date   WISDOM TOOTH EXTRACTION     Family History  Problem Relation Age of Onset   Colon cancer Mother    Sleep apnea Father    Diabetes Father    Hyperlipidemia Maternal Grandmother    Diabetes Maternal Grandmother    Colon cancer Maternal Grandfather    Colon cancer Maternal Aunt    Social History[1] Allergies[2] Medications Ordered Prior to Encounter[3]  Review of Systems Pertinent items noted in HPI and remainder of comprehensive ROS otherwise negative.  Indications for ASA therapy (per UpToDate) One of the following: (Needs 162 mg daily) Previous pregnancy with preeclampsia, especially early onset and with an adverse outcome No Chronic hypertension No Type 1 or 2 diabetes mellitus No Multifetal gestation No Chronic kidney disease No Autoimmune disease (antiphospholipid syndrome, systemic lupus erythematosus) No Two or more of the following: (Can do 81 mg daily) Nulliparity No Obesity (body mass index >30 kg/m2) Yes Family history of preeclampsia in mother or  sister No Age >=35 years No Sociodemographic characteristics (African American race, low socioeconomic level) Yes Personal risk factors (eg, previous pregnancy with low birth weight or small for gestational age infant, previous adverse pregnancy outcome [eg, stillbirth], interval >10 years between pregnancies) No In vitro conception No  Physical Exam:   Vitals:   12/01/24 1525  BP: 119/77  Pulse: 92  Weight: 202 lb 12.8 oz (92 kg)   Fetal Heart Rate (bpm): 144    General: well-developed, well-nourished female in no acute distress  Breasts:  deferred  Skin: normal coloration and turgor, no rashes  Neurologic: oriented, normal, negative, normal mood  Extremities: normal strength, tone, and muscle mass, ROM of all joints is normal  HEENT PERRLA, extraocular movement intact and sclera clear, anicteric  Neck supple and no masses  Cardiovascular: regular rate and rhythm  Respiratory:  no respiratory distress, normal breath sounds  Abdomen: soft, non-tender; bowel sounds normal; no masses,  no organomegaly  Pelvic: normal external genitalia, no lesions, normal vaginal mucosa, normal vaginal discharge, normal cervix, pap smear done. Exam done in the presence of a chaperone.    Assessment:  Pregnancy: [id number] Patient Active Problem List   Diagnosis Date Noted   Supervision of other normal pregnancy, antepartum 10/27/2024   Urticaria 05/30/2021  Anxiety and depression 05/18/2021   Family history of FAP (familial adenomatous polyposis) 05/18/2021   Assistance needed with transportation 05/18/2021   Anxiety 05/07/2021   Family history of colon cancer 05/07/2021   History of gestational diabetes 12/23/2019   Cystocele with prolapse 08/31/2019   Tobacco smoking affecting pregnancy, antepartum 08/17/2019    Plan:  1. Supervision of other normal pregnancy, antepartum (Primary) - Cytology - PAP( [place]) - Cervicovaginal ancillary only( [place]) -  CBC/D/Plt+RPR+Rh+ABO+RubIgG... - PANORAMA PRENATAL TEST - Ambulatory referral to [facility] - Hemoglobin A1c  2. History of gestational diabetes - Hemoglobin A1c  3. [redacted] weeks gestation of pregnancy   4. History of cystocele - patient states resolved after 2015 pregnancy  5. Tobacco smoking affecting pregnancy, antepartum - detailed anatomy scan ordered   6. BMI 40.0-44.9, adult (HCC) -basa therapy - detail anatomy   Initial labs drawn. Continue prenatal vitamins. Problem list reviewed and updated. Genetic Screening discussed, Panorama: ordered. Ultrasound discussed; fetal anatomic survey: planned. Anticipatory guidance about prenatal visits given including labs, ultrasounds, and testing. Weight gain recommendations per IOM guidelines reviewed: underweight/BMI 18.5 or less > 28 - 40 lbs; normal weight/BMI 18.5 - 24.9 > 25 - 35 lbs; overweight/BMI 25 - 29.9 > 15 - 25 lbs; obese/BMI 30 or more > 11 - 20 lbs. Discussed usage of the Babys[employer] app for more information about pregnancy, and to track blood pressures. Also discussed usage of virtual visits as additional source of managing and completing prenatal visits.  Patient was encouraged to use MyChart to review results, send requests, and have questions addressed.   The nature of [facility] with multiple MDs and Advanced Practice Providers was explained to patient; also emphasized that residents, students are part of our team.  Also emphasized that we do have female providers; female-only providers cannot be guaranteed. Routine obstetric precautions reviewed. Encouraged to seek out care at our office or emergency room [facility] preferred) for urgent and/or emergent concerns. Return in about 4 weeks (around 12/29/2024) for LOB, IN-PERSON.   Nat Dauer, DNP, WHNP, IBCLC        [1]  Social History Tobacco Use   Smoking status: Every Day    Current packs/day: 0.25     Types: Cigarettes   Smokeless tobacco: Never   Tobacco comments:    1 pack every 3 days   Vaping Use   Vaping status: Never Used  Substance Use Topics   Alcohol use: Not Currently   Drug use: Yes    Types: Marijuana  [2]  Allergies Allergen Reactions   Coconut (Cocos Nucifera)   [3]  Current Outpatient Medications on File Prior to Visit  Medication Sig Dispense Refill   albuterol  (VENTOLIN  HFA) 108 (90 Base) MCG/ACT inhaler Inhale 1-2 puffs into the lungs every 6 (six) hours as needed for wheezing or shortness of breath. 6.7 g 0   azelastine  (ASTELIN ) 0.1 % nasal spray Place 1 spray into both nostrils 2 (two) times daily. Use in each nostril as directed 30 mL 0   hydrOXYzine  (ATARAX ) 25 MG tablet Take 1 tablet (25 mg total) by mouth every 6 (six) hours as needed for itching. 30 tablet 2   Prenatal Vit-Fe Phos-FA-Omega (VITAFOL  GUMMIES) 3.33-0.333-34.8 MG CHEW Chew 1 tablet by mouth daily. 90 tablet 5   azithromycin  (ZITHROMAX ) 250 MG tablet Take 1 tablet (250 mg total) by mouth daily. Take first 2 tablets together, then 1 every day until finished. (Patient not taking: Reported  on 12/01/2024) 6 tablet 0   Blood Pressure Monitoring (BLOOD PRESSURE KIT) DEVI 1 Device by Does not apply route once a week. (Patient not taking: Reported on 12/01/2024) 1 each 0   No current facility-administered medications on file prior to visit.   "

## 2024-12-02 LAB — CBC/D/PLT+RPR+RH+ABO+RUBIGG...
Antibody Screen: NEGATIVE
Basophils Absolute: 0 x10E3/uL (ref 0.0–0.2)
Basos: 0 %
EOS (ABSOLUTE): 0.1 x10E3/uL (ref 0.0–0.4)
Eos: 1 %
HCV Ab: NONREACTIVE
HIV Screen 4th Generation wRfx: NONREACTIVE
Hematocrit: 36.4 % (ref 34.0–46.6)
Hemoglobin: 12.3 g/dL (ref 11.1–15.9)
Hepatitis B Surface Ag: NEGATIVE
Immature Grans (Abs): 0.1 x10E3/uL (ref 0.0–0.1)
Immature Granulocytes: 1 %
Lymphocytes Absolute: 2.3 x10E3/uL (ref 0.7–3.1)
Lymphs: 23 %
MCH: 30.4 pg (ref 26.6–33.0)
MCHC: 33.8 g/dL (ref 31.5–35.7)
MCV: 90 fL (ref 79–97)
Monocytes Absolute: 0.5 x10E3/uL (ref 0.1–0.9)
Monocytes: 5 %
Neutrophils Absolute: 6.9 x10E3/uL (ref 1.4–7.0)
Neutrophils: 70 %
Platelets: 231 x10E3/uL (ref 150–450)
RBC: 4.05 x10E6/uL (ref 3.77–5.28)
RDW: 12.5 % (ref 11.7–15.4)
RPR Ser Ql: NONREACTIVE
Rh Factor: POSITIVE
Rubella Antibodies, IGG: <0.9 {index} — ABNORMAL LOW
WBC: 9.9 x10E3/uL (ref 3.4–10.8)

## 2024-12-02 LAB — HEMOGLOBIN A1C
Est. average glucose Bld gHb Est-mCnc: 108 mg/dL
Hgb A1c MFr Bld: 5.4 % (ref 4.8–5.6)

## 2024-12-02 LAB — HCV INTERPRETATION

## 2024-12-04 ENCOUNTER — Ambulatory Visit: Payer: Self-pay | Admitting: Student

## 2024-12-04 DIAGNOSIS — Z3490 Encounter for supervision of normal pregnancy, unspecified, unspecified trimester: Secondary | ICD-10-CM | POA: Insufficient documentation

## 2024-12-04 DIAGNOSIS — N760 Acute vaginitis: Secondary | ICD-10-CM

## 2024-12-06 LAB — CERVICOVAGINAL ANCILLARY ONLY
Bacterial Vaginitis (gardnerella): POSITIVE — AB
Candida Glabrata: NEGATIVE
Candida Vaginitis: NEGATIVE
Chlamydia: NEGATIVE
Comment: NEGATIVE
Comment: NEGATIVE
Comment: NEGATIVE
Comment: NEGATIVE
Comment: NEGATIVE
Comment: NORMAL
Neisseria Gonorrhea: NEGATIVE
Trichomonas: NEGATIVE

## 2024-12-07 LAB — CYTOLOGY - PAP
Comment: NEGATIVE
Diagnosis: NEGATIVE
Diagnosis: REACTIVE
High risk HPV: NEGATIVE

## 2024-12-08 MED ORDER — METRONIDAZOLE 500 MG PO TABS: 500.0000 mg | ORAL_TABLET | Freq: Two times a day (BID) | ORAL | 0 refills | Status: AC

## 2024-12-08 NOTE — Addendum Note (Signed)
 Addended by: Teya Otterson on: 12/08/2024 12:56 PM   Modules accepted: Orders

## 2024-12-09 LAB — PANORAMA PRENATAL TEST FULL PANEL:PANORAMA TEST PLUS 5 ADDITIONAL MICRODELETIONS: FETAL FRACTION: 8.5

## 2024-12-29 ENCOUNTER — Ambulatory Visit (INDEPENDENT_AMBULATORY_CARE_PROVIDER_SITE_OTHER): Payer: Self-pay | Admitting: Physician Assistant

## 2024-12-29 VITALS — BP 110/74 | HR 85 | Wt 208.0 lb

## 2024-12-29 DIAGNOSIS — Z2839 Other underimmunization status: Secondary | ICD-10-CM

## 2024-12-29 DIAGNOSIS — Z6841 Body Mass Index (BMI) 40.0 and over, adult: Secondary | ICD-10-CM | POA: Diagnosis not present

## 2024-12-29 DIAGNOSIS — Z8372 Family history of familial adenomatous polyposis: Secondary | ICD-10-CM

## 2024-12-29 DIAGNOSIS — Z3482 Encounter for supervision of other normal pregnancy, second trimester: Secondary | ICD-10-CM | POA: Diagnosis not present

## 2024-12-29 DIAGNOSIS — Z3A17 17 weeks gestation of pregnancy: Secondary | ICD-10-CM | POA: Diagnosis not present

## 2024-12-29 DIAGNOSIS — Z3480 Encounter for supervision of other normal pregnancy, unspecified trimester: Secondary | ICD-10-CM

## 2024-12-29 NOTE — Progress Notes (Signed)
 "  PRENATAL VISIT NOTE  Subjective:  Christine Kaiser is a 34 y.o. H5E7987 at [redacted]w[redacted]d being seen today for ongoing prenatal care.  She is currently monitored for the following issues for this high-risk pregnancy and has Tobacco smoking affecting pregnancy, antepartum; Cystocele with prolapse; History of gestational diabetes; Anxiety; Family history of colon cancer; Anxiety and depression; Family history of FAP (familial adenomatous polyposis); Assistance needed with transportation; Urticaria; Supervision of other normal pregnancy, antepartum; and Rubella non-immune status, antepartum on their problem list.  Patient reports no complaints.  Contractions: Not present. Vag. Bleeding: None.  Movement: Present. Denies leaking of fluid.   The following portions of the patient's history were reviewed and updated as appropriate: allergies, current medications, past family history, past medical history, past social history, past surgical history and problem list.   Objective:   Vitals:   12/29/24 1505  BP: 110/74  Pulse: 85  Weight: 208 lb (94.3 kg)    Fetal Status:  Fetal Heart Rate (bpm): 154   Movement: Present    General: Alert, oriented and cooperative. Patient is in no acute distress.  Skin: Skin is warm and dry. No rash noted.   Cardiovascular: Normal heart rate noted  Respiratory: Normal respiratory effort, no problems with respiration noted  Abdomen: Soft, gravid, appropriate for gestational age.  Pain/Pressure: Absent     Pelvic: Cervical exam deferred        Extremities: Normal range of motion.  Edema: None  Mental Status: Normal mood and affect. Normal behavior. Normal judgment and thought content.      12/01/2024    3:37 PM 10/27/2024    3:29 PM 05/30/2021    2:51 PM  Depression screen PHQ 2/9  Decreased Interest 2 0 0  Down, Depressed, Hopeless 2 0 3  PHQ - 2 Score 4 0 3  Altered sleeping 2 1 3   Tired, decreased energy 2 1 1   Change in appetite 0 0 0  Feeling bad or  failure about yourself  0 0 0  Trouble concentrating 1 0 1  Moving slowly or fidgety/restless 0 0 0  Suicidal thoughts 0 0 0  PHQ-9 Score 9 2 8    Difficult doing work/chores   Somewhat difficult     Data saved with a previous flowsheet row definition        12/01/2024    3:38 PM 10/27/2024    3:30 PM 05/30/2021    2:50 PM 05/18/2021   11:41 AM  GAD 7 : Generalized Anxiety Score  Nervous, Anxious, on Edge 2  1  3  3    Control/stop worrying 2  1  3  3    Worry too much - different things 2  0  3  3   Trouble relaxing 1  0  1  3   Restless 3  1  1  2    Easily annoyed or irritable 1  1  1  3    Afraid - awful might happen 0  0  1  2   Total GAD 7 Score 11 4 13 19   Anxiety Difficulty   Not difficult at all      Data saved with a previous flowsheet row definition    Assessment and Plan:  Pregnancy: [id number] at [redacted]w[redacted]d  1. Supervision of other normal pregnancy, antepartum (Primary) Patient doing well BP, FHR appropriate   2. [redacted] weeks gestation of pregnancy Anticipatory guidance about next visits/weeks of pregnancy given.   3. Rubella non-immune status, antepartum MMR postpartum  4. Family history of FAP (familial adenomatous polyposis) ? Patient has significant family history of colon cancer, but does not recall this diagnosis. Last colonoscopy was at remote time. Will refer to GI so that we can make sure she's getting proper screening.   5. BMI 40.0-44.9, adult (HCC)    Preterm labor symptoms and general obstetric precautions including but not limited to vaginal bleeding, contractions, leaking of fluid and fetal movement were reviewed in detail with the patient.  Please refer to After Visit Summary for other counseling recommendations.   Return in about 4 weeks (around 01/26/2025) for [facility].  Future Appointments  Date Time Provider Department Center  01/12/2025  2:00 PM [facility] PROVIDER 1 [facility]  01/12/2025  2:30 PM [facility] US[facility]    Jorene FORBES Moats, PA-C  "

## 2024-12-29 NOTE — Progress Notes (Signed)
 ROB in office, reports fetal movement, denies pain.

## 2024-12-31 ENCOUNTER — Ambulatory Visit: Payer: Self-pay | Admitting: Physician Assistant

## 2024-12-31 LAB — AFP, SERUM, OPEN SPINA BIFIDA
AFP MoM: 1.21
AFP Value: 37.1 ng/mL
Gest. Age on Collection Date: 17 wk
Maternal Age At EDD: 33.1 a
OSBR Risk 1 IN: 6301
Test Results:: NEGATIVE
Weight: 208 [lb_av]

## 2025-01-12 ENCOUNTER — Other Ambulatory Visit

## 2025-01-12 ENCOUNTER — Ambulatory Visit

## 2025-01-27 ENCOUNTER — Encounter: Payer: Self-pay | Admitting: Obstetrics and Gynecology
